# Patient Record
Sex: Female | Born: 1950 | Race: Black or African American | Hispanic: No | Marital: Single | Smoking: Never smoker
Health system: Southern US, Community
[De-identification: ages and names within clinical notes are randomized; demographics above are authoritative.]

## PROBLEM LIST (undated history)

## (undated) DIAGNOSIS — I10 Essential (primary) hypertension: Secondary | ICD-10-CM

## (undated) DIAGNOSIS — K429 Umbilical hernia without obstruction or gangrene: Secondary | ICD-10-CM

## (undated) DIAGNOSIS — E785 Hyperlipidemia, unspecified: Secondary | ICD-10-CM

## (undated) HISTORY — DX: Hyperlipidemia, unspecified: E78.5

## (undated) HISTORY — DX: Essential (primary) hypertension: I10

## (undated) HISTORY — PX: TUBAL LIGATION: SHX77

## (undated) HISTORY — DX: Umbilical hernia without obstruction or gangrene: K42.9

---

## 2015-06-18 DIAGNOSIS — I10 Essential (primary) hypertension: Secondary | ICD-10-CM | POA: Diagnosis not present

## 2015-06-18 DIAGNOSIS — E559 Vitamin D deficiency, unspecified: Secondary | ICD-10-CM | POA: Diagnosis not present

## 2015-06-18 DIAGNOSIS — D72819 Decreased white blood cell count, unspecified: Secondary | ICD-10-CM | POA: Diagnosis not present

## 2015-06-18 DIAGNOSIS — Z139 Encounter for screening, unspecified: Secondary | ICD-10-CM | POA: Diagnosis not present

## 2015-08-22 ENCOUNTER — Ambulatory Visit (INDEPENDENT_AMBULATORY_CARE_PROVIDER_SITE_OTHER): Payer: BLUE CROSS/BLUE SHIELD | Admitting: Adult Health

## 2015-08-22 ENCOUNTER — Encounter: Payer: Self-pay | Admitting: Adult Health

## 2015-08-22 ENCOUNTER — Other Ambulatory Visit (HOSPITAL_COMMUNITY)
Admission: RE | Admit: 2015-08-22 | Discharge: 2015-08-22 | Disposition: A | Discharge status: Home / Self Care | Payer: BLUE CROSS/BLUE SHIELD | Source: Ambulatory Visit | Attending: Adult Health | Admitting: Adult Health

## 2015-08-22 ENCOUNTER — Other Ambulatory Visit: Payer: Self-pay | Admitting: Adult Health

## 2015-08-22 VITALS — BP 132/80 | HR 88 | Ht 66.0 in | Wt 140.0 lb

## 2015-08-22 DIAGNOSIS — K429 Umbilical hernia without obstruction or gangrene: Secondary | ICD-10-CM | POA: Diagnosis not present

## 2015-08-22 DIAGNOSIS — Z1211 Encounter for screening for malignant neoplasm of colon: Secondary | ICD-10-CM | POA: Diagnosis not present

## 2015-08-22 DIAGNOSIS — Z139 Encounter for screening, unspecified: Secondary | ICD-10-CM

## 2015-08-22 DIAGNOSIS — Z01419 Encounter for gynecological examination (general) (routine) without abnormal findings: Secondary | ICD-10-CM | POA: Diagnosis not present

## 2015-08-22 DIAGNOSIS — Z1231 Encounter for screening mammogram for malignant neoplasm of breast: Secondary | ICD-10-CM

## 2015-08-22 DIAGNOSIS — Z1151 Encounter for screening for human papillomavirus (HPV): Secondary | ICD-10-CM | POA: Diagnosis not present

## 2015-08-22 DIAGNOSIS — Z01411 Encounter for gynecological examination (general) (routine) with abnormal findings: Secondary | ICD-10-CM

## 2015-08-22 HISTORY — DX: Umbilical hernia without obstruction or gangrene: K42.9

## 2015-08-22 LAB — HEMOCCULT GUIAC POC 1CARD (OFFICE): Fecal Occult Blood, POC: NEGATIVE

## 2015-08-22 NOTE — Progress Notes (Signed)
Patient ID: Ruth Garrett, female   DOB: 1950-12-24, 65 y.o.   MRN: [id number] History of Present Illness: Ruth Garrett is a 65 year old black female, single, G1P1, PM, in for a well woman gyn exam and pap.She is new to this practice and has not had pap in 20 years, has never had a mammogram or colonoscopy. She works 12-8 at [employer], and the NP there check her labs.   Current Medications, Allergies, Past Medical History, Past Surgical History, Family History and Social History were reviewed in [employer] record.     Review of Systems:  Patient denies any headaches, hearing loss, fatigue, blurred vision, shortness of breath, chest pain, abdominal pain, problems with bowel movements, urination, or intercourse(not having sex). No joint pain or mood swings.   Physical Exam:BP 132/80 mmHg  Pulse 88  Ht 5\' 6"  (1.676 m)  Wt 140 lb (63.504 kg)  BMI 22.61 kg/m2 General:  Well developed, well nourished, no acute distress Skin:  Warm and dry Neck:  Midline trachea, normal thyroid, good ROM, no lymphadenopathy,no carotid bruits heard  Lungs; Clear to auscultation bilaterally Breast:  No dominant palpable mass, retraction, or nipple discharge Cardiovascular: Regular rate and rhythm Abdomen:  Soft, non tender, no hepatosplenomegaly,has small umbilical hernia Pelvic:  External genitalia is normal in appearance, no lesions.  The vagina is normal in appearance. Urethra has no lesions or masses. The cervix is smooth, pap with HPV performed.  Uterus is felt to be normal size, shape, and contour.  No adnexal masses or tenderness noted.Bladder is non tender, no masses felt. Rectal: Good sphincter tone, no polyps, or hemorrhoids felt.  Hemoccult negative. Extremities/musculoskeletal:  No swelling or varicosities noted, no clubbing or cyanosis Psych:  No mood changes, alert and cooperative,seems happy Instructed if navel area hurts and feels hard go to ER.  Impression: Well woman gyn exam and  pap Umbilical hernia     Plan: Mammogram appt made for 6/19 at 6:30 pm at [facility] Referred to Dr Oneida Alar for colonoscopy  Labs at work Physical in 1 year, pap in 3 if normal Review handout on hernia She needs PCP, several names given, as she hopes to retire soon

## 2015-08-22 NOTE — Patient Instructions (Addendum)
Physical in 1 year, pap in 3 years if normal Mammogram   6/19 at 6:30 pm at [facility] at work  Colonoscopy advised referred Dr Oneida Alar  Hernia A hernia happens when an organ or tissue inside your body pushes out through a weak spot in the belly (abdomen). HOME CARE  Avoid stretching or overusing (straining) the muscles near the hernia.  Do not lift anything heavier than 10 lb (4.5 kg).  Use the muscles in your leg when you lift something up. Do not use the muscles in your back.  When you cough, try to cough gently.  Eat a diet that has a lot of fiber. Eat lots of fruits and vegetables.  Drink enough fluids to keep your pee (urine) clear or pale yellow. Try to drink 6-8 glasses of water a day.  Take medicines to make your poop soft (stool softeners) as told by your doctor.  Lose weight, if you are overweight.  Do not use any tobacco products, including cigarettes, chewing tobacco, or electronic cigarettes. If you need help quitting, ask your doctor.  Keep all follow-up visits as told by your doctor. This is important. GET HELP IF:  The skin by the hernia gets puffy (swollen) or red.  The hernia is painful. GET HELP RIGHT AWAY IF:  You have a fever.  You have belly pain that is getting worse.  You feel sick to your stomach (nauseous) or you throw up (vomit).  You cannot push the hernia back in place by gently pressing on it while you are lying down.  The hernia:  Changes in shape or size.  Is stuck outside your belly.  Changes color.  Feels hard or tender.   This information is not intended to replace advice given to you by your health care provider. Make sure you discuss any questions you have with your health care provider.   Document Released: 08/13/2009 Document Revised: 03/16/2014 Document Reviewed: 01/03/2014 Elsevier Interactive Patient Education [employer].

## 2015-08-23 LAB — CYTOLOGY - PAP

## 2015-08-26 ENCOUNTER — Ambulatory Visit (HOSPITAL_COMMUNITY)
Admission: RE | Admit: 2015-08-26 | Discharge: 2015-08-26 | Disposition: A | Discharge status: Home / Self Care | Payer: BLUE CROSS/BLUE SHIELD | Source: Ambulatory Visit | Attending: Adult Health | Admitting: Adult Health

## 2015-08-26 DIAGNOSIS — Z1231 Encounter for screening mammogram for malignant neoplasm of breast: Secondary | ICD-10-CM | POA: Diagnosis not present

## 2015-09-16 ENCOUNTER — Telehealth: Payer: Self-pay

## 2015-09-16 NOTE — Telephone Encounter (Signed)
[place] [facility]

## 2015-09-17 ENCOUNTER — Telehealth: Payer: Self-pay

## 2015-09-23 NOTE — Telephone Encounter (Signed)
Please see separate triage.

## 2015-09-23 NOTE — Telephone Encounter (Signed)
LMOM for a return call.  

## 2015-09-25 NOTE — Telephone Encounter (Signed)
Gastroenterology Pre-Procedure Review  Request Date: 09/17/2015 Requesting Physician: Derrek Monaco, NP  PATIENT REVIEW QUESTIONS: The patient responded to the following health history questions as indicated:    1. Diabetes Melitis: no 2. Joint replacements in the past 12 months: no 3. Major health problems in the past 3 months: no 4. Has an artificial valve or MVP: no 5. Has a defibrillator: no 6. Has been advised in past to take antibiotics in advance of a procedure like teeth cleaning: no 7. Family history of colon cancer: no  8. Alcohol Use: no 9. History of sleep apnea: no     MEDICATIONS & ALLERGIES:    Patient reports the following regarding taking any blood thinners:   Plavix? no Aspirin? no Coumadin? no  Patient confirms/reports the following medications:  Current Outpatient Prescriptions  Medication Sig Dispense Refill  . cholecalciferol (VITAMIN D) 1000 units tablet Take 2,000 Units by mouth daily.    Marland Kitchen guaiFENesin (MUCINEX) 600 MG 12 hr tablet Take 600 mg by mouth 2 (two) times daily as needed.    Marland Kitchen lisinopril-hydrochlorothiazide (PRINZIDE,ZESTORETIC) 10-12.5 MG tablet Take 1 tablet by mouth daily.     No current facility-administered medications for this visit.    Patient confirms/reports the following allergies:  No Known Allergies  No orders of the defined types were placed in this encounter.    AUTHORIZATION INFORMATION Primary Insurance:   ID #:   Group #:  Pre-Cert / Auth required:  Pre-Cert / Auth #:   Secondary Insurance:   ID #:   Group #:  Pre-Cert / Auth required: Pre-Cert / Auth #:   SCHEDULE INFORMATION: Procedure has been scheduled as follows:  Date: 10/18/2015             Time: 9:30 AM Location: [facility] Short Stay  This Gastroenterology Pre-Precedure Review Form is being routed to the following provider(s): Barney Drain, MD

## 2015-09-25 NOTE — Telephone Encounter (Signed)
SUPREP SPLIT DOSING- FULL LIQUIDS WITH BREAKFAST.  Full Liquid Diet A high-calorie, high-protein supplement should be used to meet your nutritional requirements when the full liquid diet is continued for more than 2 or 3 days. If this diet is to be used for an extended period of time (more than 7 days), a multivitamin should be considered.  Breads and Starches  Allowed: None are allowed   Avoid: Any others.    Potatoes/Pasta/Rice  Allowed: ANY ITEM AS A SOUP OR SMALL PLATE OF MASHED POTATOES OR SCRAMBLED EGGS.       Vegetables  Allowed: Strained tomato or vegetable juice. Vegetables pureed in soup.   Avoid: Any others.    Fruit  Allowed: Any strained fruit juices and fruit drinks. Include 1 serving of citrus or vitamin C-enriched fruit juice daily.   Avoid: Any others.  Meat and Meat Substitutes  Allowed: Egg  Avoid: Any meat, fish, or fowl. All cheese.  Milk  Allowed: SOY Milk beverages, including milk shakes and instant breakfast mixes. Smooth yogurt.   Avoid: Any others. Avoid dairy products if not tolerated.    Soups and Combination Foods  Allowed: Broth, strained cream soups. Strained, broth-based soups.   Avoid: Any others.    Desserts and Sweets  Allowed: flavored gelatin, tapioca, ice cream, sherbet, smooth pudding, junket, fruit ices, frozen ice pops, pudding pops, frozen fudge pops, chocolate syrup. Sugar, honey, jelly, syrup.   Avoid: Any others.  Fats and Oils  Allowed: Margarine, butter, cream, sour cream, oils.   Avoid: Any others.  Beverages  Allowed: All.   Avoid: None.  Condiments  Allowed: Iodized salt, pepper, spices, flavorings. Cocoa powder.   Avoid: Any others.    SAMPLE MEAL PLAN Breakfast   cup orange juice.   1 OR 2 EGGS  1 cup milk.   1 cup beverage (coffee or tea).   Cream or sugar, if desired.    Midmorning Snack  2 SCRAMBLED OR HARD BOILED EGG   Lunch  1 cup cream soup.    cup fruit juice.    1 cup milk.    cup custard.   1 cup beverage (coffee or tea).   Cream or sugar, if desired.    Midafternoon Snack  1 cup milk shake.  Dinner  1 cup cream soup.    cup fruit juice.   1 cup MILK    cup pudding.   1 cup beverage (coffee or tea).   Cream or sugar, if desired.  Evening Snack  1 cup supplement.  To increase calories, add sugar, cream, butter, or margarine if possible. Nutritional supplements will also increase the total calories.

## 2015-09-26 ENCOUNTER — Other Ambulatory Visit: Payer: Self-pay

## 2015-09-26 DIAGNOSIS — Z1211 Encounter for screening for malignant neoplasm of colon: Secondary | ICD-10-CM

## 2015-09-26 MED ORDER — NA SULFATE-K SULFATE-MG SULF 17.5-3.13-1.6 GM/177ML PO SOLN: 1.0000 | ORAL | Status: DC

## 2015-09-26 NOTE — Telephone Encounter (Signed)
Rx sent to the pharmacy and instructions mailed to pt.  

## 2015-10-15 MED ORDER — PEG 3350-KCL-NA BICARB-NACL 420 G PO SOLR
4000.0000 mL | ORAL | 0 refills | Status: DC
Start: 2015-10-15 — End: 2015-10-18

## 2015-10-15 NOTE — Telephone Encounter (Signed)
Left message on machine for pt that the first prep was expensive and the pharmacy sent me a message.  I have sent her in a cheaper prep and faxed the instructions to the pharmacy to be given to her when she picks up the prep.   Asked her to call and confirm that she got the message.

## 2015-10-15 NOTE — Addendum Note (Signed)
Addended by: Everardo All on: 10/15/2015 01:17 PM   Modules accepted: Orders

## 2015-10-15 NOTE — Telephone Encounter (Signed)
Suprep not covered. Sent in [place] and faxed instructions to pharmacy.

## 2015-10-15 NOTE — Telephone Encounter (Signed)
REVIEWED-NO ADDITIONAL RECOMMENDATIONS. 

## 2015-10-17 ENCOUNTER — Telehealth: Payer: Self-pay

## 2015-10-17 NOTE — Telephone Encounter (Signed)
I called [employer] @ [phone] and spoke to [place]. Who said a PA is not required for the screening colonoscopy as out patient at [facility].

## 2015-10-18 ENCOUNTER — Encounter (HOSPITAL_COMMUNITY): Payer: Self-pay | Admitting: *Deleted

## 2015-10-18 ENCOUNTER — Ambulatory Visit (HOSPITAL_COMMUNITY)
Admission: RE | Admit: 2015-10-18 | Discharge: 2015-10-18 | Disposition: A | Discharge status: Home / Self Care | Payer: BLUE CROSS/BLUE SHIELD | Source: Ambulatory Visit | Attending: Gastroenterology | Admitting: Gastroenterology

## 2015-10-18 ENCOUNTER — Encounter (HOSPITAL_COMMUNITY)
Admission: RE | Disposition: A | Discharge status: Home / Self Care | Payer: Self-pay | Source: Ambulatory Visit | Attending: Gastroenterology

## 2015-10-18 DIAGNOSIS — Z1211 Encounter for screening for malignant neoplasm of colon: Secondary | ICD-10-CM

## 2015-10-18 DIAGNOSIS — Z79899 Other long term (current) drug therapy: Secondary | ICD-10-CM | POA: Insufficient documentation

## 2015-10-18 DIAGNOSIS — D122 Benign neoplasm of ascending colon: Secondary | ICD-10-CM | POA: Diagnosis not present

## 2015-10-18 DIAGNOSIS — I10 Essential (primary) hypertension: Secondary | ICD-10-CM | POA: Diagnosis not present

## 2015-10-18 DIAGNOSIS — K644 Residual hemorrhoidal skin tags: Secondary | ICD-10-CM | POA: Diagnosis not present

## 2015-10-18 DIAGNOSIS — K621 Rectal polyp: Secondary | ICD-10-CM | POA: Insufficient documentation

## 2015-10-18 DIAGNOSIS — D128 Benign neoplasm of rectum: Secondary | ICD-10-CM | POA: Diagnosis not present

## 2015-10-18 DIAGNOSIS — K648 Other hemorrhoids: Secondary | ICD-10-CM | POA: Diagnosis not present

## 2015-10-18 HISTORY — PX: COLONOSCOPY: SHX5424

## 2015-10-18 HISTORY — PX: POLYPECTOMY: SHX5525

## 2015-10-18 SURGERY — COLONOSCOPY
Anesthesia: Moderate Sedation

## 2015-10-18 MED ORDER — MIDAZOLAM HCL 5 MG/5ML IJ SOLN
INTRAMUSCULAR | Status: DC | PRN
  Administered 2015-10-18 (×2): 2 mg via INTRAVENOUS

## 2015-10-18 MED ORDER — MIDAZOLAM HCL 5 MG/5ML IJ SOLN
INTRAMUSCULAR | Status: AC
  Filled 2015-10-18: qty 10

## 2015-10-18 MED ORDER — SODIUM CHLORIDE 0.9 % IV SOLN
INTRAVENOUS | Status: DC
  Administered 2015-10-18: 1000 mL via INTRAVENOUS

## 2015-10-18 MED ORDER — MEPERIDINE HCL 100 MG/ML IJ SOLN
INTRAMUSCULAR | Status: DC | PRN
  Administered 2015-10-18 (×2): 25 mg via INTRAVENOUS

## 2015-10-18 MED ORDER — MEPERIDINE HCL 100 MG/ML IJ SOLN
INTRAMUSCULAR | Status: DC
Start: 2015-10-18 — End: 2015-10-18
  Filled 2015-10-18: qty 2

## 2015-10-18 NOTE — Op Note (Signed)
[facility] Patient Name: Ruth Garrett Procedure Date: 10/18/2015 9:01 AM MRN: [id number] Date of Birth: September 16, 1950 Attending MD: Barney Drain , MD CSN: [id number] Age: 65 Admit Type: Outpatient Procedure:                Colonoscopy WITH COLD FORCEPS/SNARE POLYPECTOMY Indications:              Screening for colorectal malignant neoplasm Providers:                Barney Drain, MD, Lurline Del, RN, Isabella Stalling,                            Technician Referring MD:              Medicines:                Meperidine 50 mg IV, Midazolam 4 mg IV Complications:            No immediate complications. Estimated Blood Loss:     Estimated blood loss was minimal. Procedure:                Pre-Anesthesia Assessment:                           - Prior to the procedure, a History and Physical                            was performed, and patient medications and                            allergies were reviewed. The patient's tolerance of                            previous anesthesia was also reviewed. The risks                            and benefits of the procedure and the sedation                            options and risks were discussed with the patient.                            All questions were answered, and informed consent                            was obtained. Prior Anticoagulants: The patient has                            taken no previous anticoagulant or antiplatelet                            agents. ASA Grade Assessment: I - A normal, healthy                            patient. After reviewing the risks and benefits,  the patient was deemed in satisfactory condition to                            undergo the procedure. After obtaining informed                            consent, the colonoscope was passed under direct                            vision. Throughout the procedure, the patient's                            blood pressure, pulse, and  oxygen saturations were                            monitored continuously. The [id number]                            scope was introduced through the anus and advanced                            to the the cecum, identified by appendiceal orifice                            and ileocecal valve. The ileocecal valve,                            appendiceal orifice, and rectum were photographed.                            The colonoscopy was somewhat difficult due to a                            tortuous colon. Successful completion of the                            procedure was aided by COLOWRAP. The patient                            tolerated the procedure well. The quality of the                            bowel preparation was excellent. Scope In: 9:21:09 AM Scope Out: 9:39:08 AM Scope Withdrawal Time: 0 hours 13 minutes 40 seconds  Total Procedure Duration: 0 hours 17 minutes 59 seconds  Findings:      The digital rectal exam findings include non-thrombosed external       hemorrhoids.      A 2 mm polyp was found in the proximal ascending colon. The polyp was       sessile. The polyp was removed with a cold biopsy forceps. Resection and       retrieval were complete.      A 7 mm polyp was found in the proximal ascending colon. The polyp was       sessile.  The polyp was removed with a hot snare. Resection and retrieval       were complete.      A 10 mm polyp was found in the rectum. The polyp was pedunculated. The       polyp was removed with a hot snare. Resection and retrieval were       complete.      Non-bleeding internal hemorrhoids were found. The hemorrhoids were       moderate.      Non-bleeding external hemorrhoids were found. The hemorrhoids were large. Impression:               - Non-thrombosed external hemorrhoids found on                            digital rectal exam.                           - THREE polyps REMOVED                           - Non-bleeding  internal hemorrhoids.                           - Non-bleeding external hemorrhoids. Moderate Sedation:      Moderate (conscious) sedation was administered by the endoscopy nurse       and supervised by the endoscopist. The following parameters were       monitored: oxygen saturation, heart rate, blood pressure, and response       to care. Total physician intraservice time was 29 minutes. Recommendation:           - High fiber diet.                           - Continue present medications.                           - Await pathology results.                           - Repeat colonoscopy in 3 years for surveillance                            WITH COLOWRAP.                           - Patient has a contact number available for                            emergencies. The signs and symptoms of potential                            delayed complications were discussed with the                            patient. Return to normal activities tomorrow.                            Written discharge  instructions were provided to the                            patient. Procedure Code(s):        --- Professional ---                           [phone], Colonoscopy, flexible; with removal of                            tumor(s), polyp(s), or other lesion(s) by snare                            technique                           45380, 59, Colonoscopy, flexible; with biopsy,                            single or multiple                           99152, Moderate sedation services provided by the                            same physician or other qualified health care                            professional performing the diagnostic or                            therapeutic service that the sedation supports,                            requiring the presence of an independent trained                            observer to assist in the monitoring of the                            patient's level of  consciousness and physiological                            status; initial 15 minutes of intraservice time,                            patient age 6 years or older                           [id number], Moderate sedation services; each additional                            15 minutes intraservice time Diagnosis Code(s):        --- Professional ---  Z12.11, Encounter for screening for malignant                            neoplasm of colon                           D12.2, Benign neoplasm of ascending colon                           K62.1, Rectal polyp                           K64.4, Residual hemorrhoidal skin tags                           K64.8, Other hemorrhoids CPT copyright 2016 American Medical Association. All rights reserved. The codes documented in this report are preliminary and upon coder review may  be revised to meet current compliance requirements. Barney Drain, MD Barney Drain, MD 10/18/2015 10:01:49 AM This report has been signed electronically. Number of Addenda: 0

## 2015-10-18 NOTE — Discharge Instructions (Signed)
You have LARGE EXTERNAL AND MODERATE linternal hemorrhoid. YOU HAD THREE POLYPS REMOVED.    DRINK WATER TO KEEP YOUR URINE LIGHT YELLOW.  FOLLOW A HIGH FIBER DIET. AVOID ITEMS THAT CAUSE BLOATING. See info below.  USE PREPARATION H FOUR TIMES  A DAY IF NEEDED TO RELIEVE RECTAL PAIN/PRESSURE/BLEEDING.  YOUR BIOPSY RESULTS WILL BE AVAILABLE IN MY CHART  AUG 15 AND MY OFFICE WILL CONTACT YOU IN 10-14 DAYS WITH YOUR RESULTS.   Next colonoscopy in 3 years.  Colonoscopy Care After Read the instructions outlined below and refer to this sheet in the next week. These discharge instructions provide you with general information on caring for yourself after you leave the hospital. While your treatment has been planned according to the most current medical practices available, unavoidable complications occasionally occur. If you have any problems or questions after discharge, call DR. Chareese Sergent, [phone].  ACTIVITY  You may resume your regular activity, but move at a slower pace for the next 24 hours.   Take frequent rest periods for the next 24 hours.   Walking will help get rid of the air and reduce the bloated feeling in your belly (abdomen).   No driving for 24 hours (because of the medicine (anesthesia) used during the test).   You may shower.   Do not sign any important legal documents or operate any machinery for 24 hours (because of the anesthesia used during the test).    NUTRITION  Drink plenty of fluids.   You may resume your normal diet as instructed by your doctor.   Begin with a light meal and progress to your normal diet. Heavy or fried foods are harder to digest and may make you feel sick to your stomach (nauseated).   Avoid alcoholic beverages for 24 hours or as instructed.    MEDICATIONS  You may resume your normal medications.   WHAT YOU CAN EXPECT TODAY  Some feelings of bloating in the abdomen.   Passage of more gas than usual.   Spotting of blood in  your stool or on the toilet paper  .  IF YOU HAD POLYPS REMOVED DURING THE COLONOSCOPY:  Eat a soft diet IF YOU HAVE NAUSEA, BLOATING, ABDOMINAL PAIN, OR VOMITING.    FINDING OUT THE RESULTS OF YOUR TEST Not all test results are available during your visit. DR. Oneida Alar WILL CALL YOU WITHIN 14 DAYS OF YOUR PROCEDUE WITH YOUR RESULTS. Do not assume everything is normal if you have not heard from DR. Shaelin Lalley, CALL HER OFFICE AT [phone].  SEEK IMMEDIATE MEDICAL ATTENTION AND CALL THE OFFICE: [phone] IF:  You have more than a spotting of blood in your stool.   Your belly is swollen (abdominal distention).   You are nauseated or vomiting.   You have a temperature over 101F.   You have abdominal pain or discomfort that is severe or gets worse throughout the day.  High-Fiber Diet A high-fiber diet changes your normal diet to include more whole grains, legumes, fruits, and vegetables. Changes in the diet involve replacing refined carbohydrates with unrefined foods. The calorie level of the diet is essentially unchanged. The Dietary Reference Intake (recommended amount) for adult males is 38 grams per day. For adult females, it is 25 grams per day. Pregnant and lactating women should consume 28 grams of fiber per day. Fiber is the intact part of a plant that is not broken down during digestion. Functional fiber is fiber that has been isolated from the plant to provide  a beneficial effect in the body. PURPOSE  Increase stool bulk.   Ease and regulate bowel movements.   Lower cholesterol.  REDUCE RISK OF COLON CANCER  INDICATIONS THAT YOU NEED MORE FIBER  Constipation and hemorrhoids.   Uncomplicated diverticulosis (intestine condition) and irritable bowel syndrome.   Weight management.   As a protective measure against hardening of the arteries (atherosclerosis), diabetes, and cancer.   GUIDELINES FOR INCREASING FIBER IN THE DIET  Start adding fiber to the diet slowly. A  gradual increase of about 5 more grams (2 slices of whole-wheat bread, 2 servings of most fruits or vegetables, or 1 bowl of high-fiber cereal) per day is best. Too rapid an increase in fiber may result in constipation, flatulence, and bloating.   Drink enough water and fluids to keep your urine clear or pale yellow. Water, juice, or caffeine-free drinks are recommended. Not drinking enough fluid may cause constipation.   Eat a variety of high-fiber foods rather than one type of fiber.   Try to increase your intake of fiber through using high-fiber foods rather than fiber pills or supplements that contain small amounts of fiber.   The goal is to change the types of food eaten. Do not supplement your present diet with high-fiber foods, but replace foods in your present diet.   INCLUDE A VARIETY OF FIBER SOURCES  Replace refined and processed grains with whole grains, canned fruits with fresh fruits, and incorporate other fiber sources. White rice, white breads, and most bakery goods contain little or no fiber.   Brown whole-grain rice, buckwheat oats, and many fruits and vegetables are all good sources of fiber. These include: broccoli, Brussels sprouts, cabbage, cauliflower, beets, sweet potatoes, white potatoes (skin on), carrots, tomatoes, eggplant, squash, berries, fresh fruits, and dried fruits.   Cereals appear to be the richest source of fiber. Cereal fiber is found in whole grains and bran. Bran is the fiber-rich outer coat of cereal grain, which is largely removed in refining. In whole-grain cereals, the bran remains. In breakfast cereals, the largest amount of fiber is found in those with "bran" in their names. The fiber content is sometimes indicated on the label.   You may need to include additional fruits and vegetables each day.   In baking, for 1 cup white flour, you may use the following substitutions:   1 cup whole-wheat flour minus 2 tablespoons.   1/2 cup white flour plus  1/2 cup whole-wheat flour.   Polyps, Colon  A polyp is extra tissue that grows inside your body. Colon polyps grow in the large intestine. The large intestine, also called the colon, is part of your digestive system. It is a long, hollow tube at the end of your digestive tract where your body makes and stores stool. Most polyps are not dangerous. They are benign. This means they are not cancerous. But over time, some types of polyps can turn into cancer. Polyps that are smaller than a pea are usually not harmful. But larger polyps could someday become or may already be cancerous. To be safe, doctors remove all polyps and test them.   PREVENTION There is not one sure way to prevent polyps. You might be able to lower your risk of getting them if you:  Eat more fruits and vegetables and less fatty food.   Do not smoke.   Avoid alcohol.   Exercise every day.   Lose weight if you are overweight.   Eating more calcium and folate can  also lower your risk of getting polyps. Some foods that are rich in calcium are milk, cheese, and broccoli. Some foods that are rich in folate are chickpeas, kidney beans, and spinach.     Hemorrhoids Hemorrhoids are dilated (enlarged) veins around the rectum. Sometimes clots will form in the veins. This makes them swollen and painful. These are called thrombosed hemorrhoids. Causes of hemorrhoids include:  Constipation.   Straining to have a bowel movement.   HEAVY LIFTING  HOME CARE INSTRUCTIONS  Eat a well balanced diet and drink 6 to 8 glasses of water every day to avoid constipation. You may also use a bulk laxative.   Avoid straining to have bowel movements.   Keep anal area dry and clean.   Do not use a donut shaped pillow or sit on the toilet for long periods. This increases blood pooling and pain.   Move your bowels when your body has the urge; this will require less straining and will decrease pain and pressure.

## 2015-10-18 NOTE — H&P (Signed)
  Primary Care Physician:  No PCP Per Patient Primary Gastroenterologist:  Dr. Oneida Alar  Pre-Procedure History & Physical: HPI:  Ruth Garrett is a 65 y.o. female here for [place].  Past Medical History:  Diagnosis Date  . Hypertension   . Umbilical hernia 9/68/8648    Past Surgical History:  Procedure Laterality Date  . TUBAL LIGATION      Prior to Admission medications   Medication Sig Start Date End Date Taking? Authorizing Provider  cholecalciferol (VITAMIN D) 1000 units tablet Take 2,000 Units by mouth daily.   Yes Historical Provider, MD  lisinopril-hydrochlorothiazide (PRINZIDE,ZESTORETIC) 10-12.5 MG tablet Take 1 tablet by mouth daily.   Yes Historical Provider, MD  Na Sulfate-K Sulfate-Mg Sulf (SUPREP BOWEL PREP KIT) 17.5-3.13-1.6 GM/180ML SOLN Take 1 kit by mouth as directed. 09/26/15  Yes Danie Binder, MD  guaiFENesin (MUCINEX) 600 MG 12 hr tablet Take 600 mg by mouth 2 (two) times daily as needed.    Historical Provider, MD  polyethylene glycol-electrolytes (TRILYTE) 420 g solution Take 4,000 mLs by mouth as directed. 10/15/15   Danie Binder, MD    Allergies as of 09/26/2015  . (No Known Allergies)    Family History  Problem Relation Age of Onset  . Stroke Mother   . Cancer Father     kidney  . Cancer Sister     lung  . Hypertension Son   . Other Sister     brain tumor  . Other Sister     heart problems  . Other Sister     heart problems    Social History   Social History  . Marital status: Single    Spouse name: N/A  . Number of children: N/A  . Years of education: N/A   Occupational History  . Not on file.   Social History Main Topics  . Smoking status: Never Smoker  . Smokeless tobacco: Never Used  . Alcohol use No  . Drug use: No  . Sexual activity: Not Currently    Birth control/ protection: Post-menopausal   Other Topics Concern  . Not on file   Social History Narrative  . No narrative on file    Review of  Systems: See HPI, otherwise negative ROS   Physical Exam: BP (!) 141/74   Pulse 74   Temp 98.5 F (36.9 C) (Oral)   Resp 17   Ht _0  (1.676 m)   Wt 140 lb (63.5 kg)   SpO2 100%   BMI 22.60 kg/m  General:   Alert,  pleasant and cooperative in NAD Head:  Normocephalic and atraumatic. Neck:  Supple; Lungs:  Clear throughout to auscultation.    Heart:  Regular rate and rhythm. Abdomen:  Soft, nontender and nondistended. Normal bowel sounds, without guarding, and without rebound.   Neurologic:  Alert and  oriented x4;  grossly normal neurologically.  Impression/Plan:     SCREENING  Plan:  1. TCS TODAY

## 2015-10-23 ENCOUNTER — Encounter (HOSPITAL_COMMUNITY): Payer: Self-pay | Admitting: Gastroenterology

## 2015-10-24 ENCOUNTER — Telehealth: Payer: Self-pay | Admitting: Gastroenterology

## 2015-10-24 NOTE — Telephone Encounter (Signed)
5-10 YEARS IS CORRECT-ONLY 2 SIMPLE ADENOMAS REMOVED.

## 2015-10-24 NOTE — Telephone Encounter (Signed)
I had NICed patient to have colonoscopy again in 3 years. Is this correct because you also noted 5-10 yrs. Please advise.

## 2015-10-24 NOTE — Telephone Encounter (Signed)
Please call pt. 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   DRINK WATER TO KEEP YOUR URINE LIGHT YELLOW.  FOLLOW A HIGH FIBER DIET. AVOID ITEMS THAT CAUSE BLOATING.   USE PREPARATION H FOUR TIMES  A DAY IF NEEDED TO RELIEVE RECTAL PAIN/PRESSURE/BLEEDING.  Next colonoscopy in 5-10 years.

## 2015-10-24 NOTE — Telephone Encounter (Signed)
LMOM to call.

## 2015-10-25 ENCOUNTER — Telehealth: Payer: Self-pay | Admitting: Gastroenterology

## 2015-10-25 NOTE — Telephone Encounter (Signed)
Pt was returning a call to DS. Please call [phone]

## 2015-10-31 NOTE — Telephone Encounter (Signed)
See results note. Pt is aware.  

## 2015-10-31 NOTE — Telephone Encounter (Signed)
PT is aware of results.  

## 2015-11-19 DIAGNOSIS — E559 Vitamin D deficiency, unspecified: Secondary | ICD-10-CM | POA: Diagnosis not present

## 2015-11-19 DIAGNOSIS — D72819 Decreased white blood cell count, unspecified: Secondary | ICD-10-CM | POA: Diagnosis not present

## 2015-11-19 DIAGNOSIS — Z139 Encounter for screening, unspecified: Secondary | ICD-10-CM | POA: Diagnosis not present

## 2015-11-19 DIAGNOSIS — Z79899 Other long term (current) drug therapy: Secondary | ICD-10-CM | POA: Diagnosis not present

## 2015-12-03 DIAGNOSIS — E559 Vitamin D deficiency, unspecified: Secondary | ICD-10-CM | POA: Diagnosis not present

## 2015-12-03 DIAGNOSIS — D72819 Decreased white blood cell count, unspecified: Secondary | ICD-10-CM | POA: Diagnosis not present

## 2015-12-03 DIAGNOSIS — I10 Essential (primary) hypertension: Secondary | ICD-10-CM | POA: Diagnosis not present

## 2016-01-13 DIAGNOSIS — Z6823 Body mass index (BMI) 23.0-23.9, adult: Secondary | ICD-10-CM | POA: Diagnosis not present

## 2016-01-13 DIAGNOSIS — I10 Essential (primary) hypertension: Secondary | ICD-10-CM | POA: Diagnosis not present

## 2016-01-13 DIAGNOSIS — R7989 Other specified abnormal findings of blood chemistry: Secondary | ICD-10-CM | POA: Diagnosis not present

## 2016-01-14 DIAGNOSIS — D72819 Decreased white blood cell count, unspecified: Secondary | ICD-10-CM | POA: Diagnosis not present

## 2016-01-21 DIAGNOSIS — I10 Essential (primary) hypertension: Secondary | ICD-10-CM | POA: Diagnosis not present

## 2016-01-21 DIAGNOSIS — D72819 Decreased white blood cell count, unspecified: Secondary | ICD-10-CM | POA: Diagnosis not present

## 2016-01-22 ENCOUNTER — Other Ambulatory Visit (HOSPITAL_COMMUNITY): Payer: Self-pay | Admitting: Internal Medicine

## 2016-01-22 DIAGNOSIS — Z79899 Other long term (current) drug therapy: Secondary | ICD-10-CM

## 2016-01-27 ENCOUNTER — Ambulatory Visit (HOSPITAL_COMMUNITY)
Admission: RE | Admit: 2016-01-27 | Discharge: 2016-01-27 | Disposition: A | Discharge status: Home / Self Care | Payer: BLUE CROSS/BLUE SHIELD | Source: Ambulatory Visit | Attending: Internal Medicine | Admitting: Internal Medicine

## 2016-01-27 DIAGNOSIS — Z780 Asymptomatic menopausal state: Secondary | ICD-10-CM | POA: Insufficient documentation

## 2016-01-27 DIAGNOSIS — Z79899 Other long term (current) drug therapy: Secondary | ICD-10-CM

## 2016-01-27 DIAGNOSIS — Z13820 Encounter for screening for osteoporosis: Secondary | ICD-10-CM | POA: Insufficient documentation

## 2016-02-03 DIAGNOSIS — Z0000 Encounter for general adult medical examination without abnormal findings: Secondary | ICD-10-CM | POA: Diagnosis not present

## 2016-02-03 DIAGNOSIS — R7989 Other specified abnormal findings of blood chemistry: Secondary | ICD-10-CM | POA: Diagnosis not present

## 2016-02-03 DIAGNOSIS — I10 Essential (primary) hypertension: Secondary | ICD-10-CM | POA: Diagnosis not present

## 2016-02-18 DIAGNOSIS — I10 Essential (primary) hypertension: Secondary | ICD-10-CM | POA: Diagnosis not present

## 2016-04-07 DIAGNOSIS — I10 Essential (primary) hypertension: Secondary | ICD-10-CM | POA: Diagnosis not present

## 2016-04-21 DIAGNOSIS — I10 Essential (primary) hypertension: Secondary | ICD-10-CM | POA: Diagnosis not present

## 2016-06-02 DIAGNOSIS — Z008 Encounter for other general examination: Secondary | ICD-10-CM | POA: Diagnosis not present

## 2016-06-02 DIAGNOSIS — Z713 Dietary counseling and surveillance: Secondary | ICD-10-CM | POA: Diagnosis not present

## 2016-06-02 DIAGNOSIS — E559 Vitamin D deficiency, unspecified: Secondary | ICD-10-CM | POA: Diagnosis not present

## 2016-06-02 DIAGNOSIS — Z79899 Other long term (current) drug therapy: Secondary | ICD-10-CM | POA: Diagnosis not present

## 2016-06-02 DIAGNOSIS — D72819 Decreased white blood cell count, unspecified: Secondary | ICD-10-CM | POA: Diagnosis not present

## 2016-06-02 DIAGNOSIS — I10 Essential (primary) hypertension: Secondary | ICD-10-CM | POA: Diagnosis not present

## 2016-06-16 DIAGNOSIS — I10 Essential (primary) hypertension: Secondary | ICD-10-CM | POA: Diagnosis not present

## 2016-06-16 DIAGNOSIS — D72819 Decreased white blood cell count, unspecified: Secondary | ICD-10-CM | POA: Diagnosis not present

## 2016-09-10 ENCOUNTER — Other Ambulatory Visit: Payer: Self-pay | Admitting: Adult Health

## 2016-09-10 DIAGNOSIS — Z1231 Encounter for screening mammogram for malignant neoplasm of breast: Secondary | ICD-10-CM

## 2016-09-14 ENCOUNTER — Ambulatory Visit (HOSPITAL_COMMUNITY)
Admission: RE | Admit: 2016-09-14 | Discharge: 2016-09-14 | Disposition: A | Discharge status: Home / Self Care | Payer: BLUE CROSS/BLUE SHIELD | Source: Ambulatory Visit | Attending: Adult Health | Admitting: Adult Health

## 2016-09-14 ENCOUNTER — Ambulatory Visit (HOSPITAL_COMMUNITY): Payer: BLUE CROSS/BLUE SHIELD

## 2016-09-14 DIAGNOSIS — Z1231 Encounter for screening mammogram for malignant neoplasm of breast: Secondary | ICD-10-CM | POA: Insufficient documentation

## 2016-09-29 DIAGNOSIS — I10 Essential (primary) hypertension: Secondary | ICD-10-CM | POA: Diagnosis not present

## 2016-10-05 ENCOUNTER — Ambulatory Visit (INDEPENDENT_AMBULATORY_CARE_PROVIDER_SITE_OTHER): Payer: BLUE CROSS/BLUE SHIELD | Admitting: Adult Health

## 2016-10-05 ENCOUNTER — Encounter: Payer: Self-pay | Admitting: Adult Health

## 2016-10-05 VITALS — BP 128/68 | HR 72 | Ht 66.0 in | Wt 146.0 lb

## 2016-10-05 DIAGNOSIS — Z01419 Encounter for gynecological examination (general) (routine) without abnormal findings: Secondary | ICD-10-CM | POA: Diagnosis not present

## 2016-10-05 DIAGNOSIS — Z1212 Encounter for screening for malignant neoplasm of rectum: Secondary | ICD-10-CM

## 2016-10-05 DIAGNOSIS — Z1211 Encounter for screening for malignant neoplasm of colon: Secondary | ICD-10-CM | POA: Diagnosis not present

## 2016-10-05 LAB — HEMOCCULT GUIAC POC 1CARD (OFFICE): FECAL OCCULT BLD: NEGATIVE

## 2016-10-05 NOTE — Progress Notes (Signed)
Patient ID: Beyonce Sawatzky, female   DOB: 1951-02-05, 66 y.o.   MRN: [id number] History of Present Illness: Latrelle is a 66 year old black female, PM in for well woman gyn exam, she had normal pap with negative HPV 08/22/15.She is still working at [employer].  PCP is Dr Nevada Crane.   Current Medications, Allergies, Past Medical History, Past Surgical History, Family History and Social History were reviewed in [employer] [employer] record.     Review of Systems: Patient denies any headaches, hearing loss, fatigue, blurred vision, shortness of breath, chest pain, abdominal pain, problems with bowel movements, urination, or intercourse(not having sex). No joint pain or mood swings.    Physical Exam:BP 128/68 (BP Location: Right Arm, Patient Position: Sitting, Cuff Size: Normal)   Pulse 72   Ht 5\' 6"  (1.676 m)   Wt 146 lb (66.2 kg)   BMI 23.57 kg/m  General:  Well developed, well nourished, no acute distress Skin:  Warm and dry Neck:  Midline trachea, normal thyroid, good ROM, no lymphadenopathy,no carotid bruits heard Lungs; Clear to auscultation bilaterally Breast:  No dominant palpable mass, retraction, or nipple discharge Cardiovascular: Regular rate and rhythm Abdomen:  Soft, non tender, no hepatosplenomegaly,small umbilical hernia  Pelvic:  External genitalia is normal in appearance, no lesions.  The vagina is normal in appearance.+cystocele. Urethra has no lesions or masses. The cervix is bulbous.  Uterus is felt to be normal size, shape, and contour.  No adnexal masses or tenderness noted.Bladder is non tender, no masses felt. Rectal: Good sphincter tone, no polyps, or hemorrhoids felt.  Hemoccult negative. Extremities/musculoskeletal:  No swelling or varicosities noted, no clubbing or cyanosis Psych:  No mood changes, alert and cooperative,seems happy PHQ 2 score 0.  Impression: 1. Well woman exam with routine gynecological exam   2. Screening for colorectal cancer        Plan:  Physical in 1 year Pap in 2020  Mammogram yearly Labs at work  Colonoscopy per GI

## 2016-10-05 NOTE — Patient Instructions (Addendum)
Physical in 1 year Pap in 2020  Mammogram yearly Labs at work  Colonoscopy per GI

## 2016-11-24 DIAGNOSIS — Z79899 Other long term (current) drug therapy: Secondary | ICD-10-CM | POA: Diagnosis not present

## 2016-11-24 DIAGNOSIS — Z139 Encounter for screening, unspecified: Secondary | ICD-10-CM | POA: Diagnosis not present

## 2016-11-24 DIAGNOSIS — E559 Vitamin D deficiency, unspecified: Secondary | ICD-10-CM | POA: Diagnosis not present

## 2016-12-08 DIAGNOSIS — R7301 Impaired fasting glucose: Secondary | ICD-10-CM | POA: Diagnosis not present

## 2016-12-08 DIAGNOSIS — I10 Essential (primary) hypertension: Secondary | ICD-10-CM | POA: Diagnosis not present

## 2016-12-08 DIAGNOSIS — D72819 Decreased white blood cell count, unspecified: Secondary | ICD-10-CM | POA: Diagnosis not present

## 2017-02-03 DIAGNOSIS — Z0000 Encounter for general adult medical examination without abnormal findings: Secondary | ICD-10-CM | POA: Diagnosis not present

## 2017-02-04 DIAGNOSIS — Z0000 Encounter for general adult medical examination without abnormal findings: Secondary | ICD-10-CM | POA: Diagnosis not present

## 2017-02-09 DIAGNOSIS — R7301 Impaired fasting glucose: Secondary | ICD-10-CM | POA: Diagnosis not present

## 2017-02-09 DIAGNOSIS — I10 Essential (primary) hypertension: Secondary | ICD-10-CM | POA: Diagnosis not present

## 2017-02-09 DIAGNOSIS — D72819 Decreased white blood cell count, unspecified: Secondary | ICD-10-CM | POA: Diagnosis not present

## 2017-06-22 DIAGNOSIS — Z139 Encounter for screening, unspecified: Secondary | ICD-10-CM | POA: Diagnosis not present

## 2017-06-22 DIAGNOSIS — R7301 Impaired fasting glucose: Secondary | ICD-10-CM | POA: Diagnosis not present

## 2017-06-22 DIAGNOSIS — Z008 Encounter for other general examination: Secondary | ICD-10-CM | POA: Diagnosis not present

## 2017-06-22 DIAGNOSIS — E559 Vitamin D deficiency, unspecified: Secondary | ICD-10-CM | POA: Diagnosis not present

## 2017-06-22 DIAGNOSIS — I10 Essential (primary) hypertension: Secondary | ICD-10-CM | POA: Diagnosis not present

## 2017-06-22 DIAGNOSIS — Z79899 Other long term (current) drug therapy: Secondary | ICD-10-CM | POA: Diagnosis not present

## 2017-07-06 DIAGNOSIS — I10 Essential (primary) hypertension: Secondary | ICD-10-CM | POA: Diagnosis not present

## 2017-07-06 DIAGNOSIS — D72819 Decreased white blood cell count, unspecified: Secondary | ICD-10-CM | POA: Diagnosis not present

## 2017-07-06 DIAGNOSIS — E559 Vitamin D deficiency, unspecified: Secondary | ICD-10-CM | POA: Diagnosis not present

## 2017-07-06 DIAGNOSIS — R7301 Impaired fasting glucose: Secondary | ICD-10-CM | POA: Diagnosis not present

## 2017-09-06 ENCOUNTER — Other Ambulatory Visit: Payer: Self-pay | Admitting: Adult Health

## 2017-09-06 DIAGNOSIS — Z1231 Encounter for screening mammogram for malignant neoplasm of breast: Secondary | ICD-10-CM

## 2017-09-20 ENCOUNTER — Ambulatory Visit (HOSPITAL_COMMUNITY)
Admission: RE | Admit: 2017-09-20 | Discharge: 2017-09-20 | Disposition: A | Discharge status: Home / Self Care | Payer: BLUE CROSS/BLUE SHIELD | Source: Ambulatory Visit | Attending: Adult Health | Admitting: Adult Health

## 2017-09-20 DIAGNOSIS — Z1231 Encounter for screening mammogram for malignant neoplasm of breast: Secondary | ICD-10-CM | POA: Insufficient documentation

## 2017-09-21 ENCOUNTER — Other Ambulatory Visit: Payer: Self-pay | Admitting: Adult Health

## 2017-09-21 DIAGNOSIS — R928 Other abnormal and inconclusive findings on diagnostic imaging of breast: Secondary | ICD-10-CM

## 2017-09-28 ENCOUNTER — Ambulatory Visit (HOSPITAL_COMMUNITY)
Admission: RE | Admit: 2017-09-28 | Discharge: 2017-09-28 | Disposition: A | Discharge status: Home / Self Care | Payer: BLUE CROSS/BLUE SHIELD | Source: Ambulatory Visit | Attending: Adult Health | Admitting: Adult Health

## 2017-09-28 DIAGNOSIS — R928 Other abnormal and inconclusive findings on diagnostic imaging of breast: Secondary | ICD-10-CM | POA: Diagnosis not present

## 2017-09-28 DIAGNOSIS — R922 Inconclusive mammogram: Secondary | ICD-10-CM | POA: Diagnosis not present

## 2017-10-26 DIAGNOSIS — Z139 Encounter for screening, unspecified: Secondary | ICD-10-CM | POA: Diagnosis not present

## 2017-10-26 DIAGNOSIS — Z0130 Encounter for examination of blood pressure without abnormal findings: Secondary | ICD-10-CM | POA: Diagnosis not present

## 2017-10-26 DIAGNOSIS — D72819 Decreased white blood cell count, unspecified: Secondary | ICD-10-CM | POA: Diagnosis not present

## 2017-10-26 DIAGNOSIS — Z79899 Other long term (current) drug therapy: Secondary | ICD-10-CM | POA: Diagnosis not present

## 2017-11-09 DIAGNOSIS — E559 Vitamin D deficiency, unspecified: Secondary | ICD-10-CM | POA: Diagnosis not present

## 2017-11-09 DIAGNOSIS — Z719 Counseling, unspecified: Secondary | ICD-10-CM | POA: Diagnosis not present

## 2017-11-09 DIAGNOSIS — Z008 Encounter for other general examination: Secondary | ICD-10-CM | POA: Diagnosis not present

## 2017-11-09 DIAGNOSIS — I10 Essential (primary) hypertension: Secondary | ICD-10-CM | POA: Diagnosis not present

## 2017-11-15 ENCOUNTER — Encounter: Payer: Self-pay | Admitting: Adult Health

## 2017-11-15 ENCOUNTER — Encounter (INDEPENDENT_AMBULATORY_CARE_PROVIDER_SITE_OTHER): Payer: Self-pay

## 2017-11-15 ENCOUNTER — Ambulatory Visit (INDEPENDENT_AMBULATORY_CARE_PROVIDER_SITE_OTHER): Payer: BLUE CROSS/BLUE SHIELD | Admitting: Adult Health

## 2017-11-15 VITALS — BP 127/59 | HR 71 | Ht 66.0 in | Wt 142.0 lb

## 2017-11-15 DIAGNOSIS — N812 Incomplete uterovaginal prolapse: Secondary | ICD-10-CM | POA: Insufficient documentation

## 2017-11-15 DIAGNOSIS — Z1212 Encounter for screening for malignant neoplasm of rectum: Secondary | ICD-10-CM

## 2017-11-15 DIAGNOSIS — Z01419 Encounter for gynecological examination (general) (routine) without abnormal findings: Secondary | ICD-10-CM | POA: Diagnosis not present

## 2017-11-15 DIAGNOSIS — Z1211 Encounter for screening for malignant neoplasm of colon: Secondary | ICD-10-CM | POA: Diagnosis not present

## 2017-11-15 LAB — HEMOCCULT GUIAC POC 1CARD (OFFICE): Fecal Occult Blood, POC: NEGATIVE

## 2017-11-15 NOTE — Progress Notes (Signed)
Patient ID: Ruth Garrett, female   DOB: 1950-06-29, 67 y.o.   MRN: [id number] History of Present Illness: Ruth Garrett is a 67 year old black female in for well woman gyn exam,she had a normal pap with negative HPV 08/22/15.She is still working at [employer]. PCP is Dr Nevada Crane.   Current Medications, Allergies, Past Medical History, Past Surgical History, Family History and Social History were reviewed in [employer] record.     Review of Systems: Patient denies any headaches, hearing loss, fatigue, blurred vision, shortness of breath, chest pain, abdominal pain, problems with bowel movements, urination, or intercourse(not having sex). No joint pain or mood swings.    Physical Exam:BP (!) 127/59 (BP Location: Left Arm, Patient Position: Sitting, Cuff Size: Normal)   Pulse 71   Ht 5\' 6"  (1.676 m)   Wt 142 lb (64.4 kg)   BMI 22.92 kg/m  General:  Well developed, well nourished, no acute distress Skin:  Warm and dry Neck:  Midline trachea, normal thyroid, good ROM, no lymphadenopathy,no carotid bruits heard. Lungs; Clear to auscultation bilaterally Breast:  No dominant palpable mass, retraction, or nipple discharge Cardiovascular: Regular rate and rhythm Abdomen:  Soft, non tender, no hepatosplenomegaly Pelvic:  External genitalia is normal in appearance, no lesions.  The vagina is normal in appearance, has +cystocele, to introitus. Urethra has no lesions or masses. The cervix is bulbous.  Uterus is felt to be normal size, shape, and contour.  No adnexal masses or tenderness noted.Bladder is non tender, no masses felt. Rectal: Good sphincter tone, no polyps, or hemorrhoids felt.  Hemoccult negative. Extremities/musculoskeletal:  No swelling or varicosities noted, no clubbing or cyanosis Psych:  No mood changes, alert and cooperative,seems happy PHQ 9 score 0. Examination chaperoned by Diona Fanti, CMA. Discussed pessary as option and given handout on pessaries.   Impression: 1.  Encounter for well woman exam with routine gynecological exam   2. Screening for colorectal cancer   3. Cystocele with incomplete uterovaginal prolapse       Plan: Pap and physical in 1 year Labs per PCP Mammogram yearly Return October 21 for pessary fitting with Dr Elonda Husky

## 2017-12-27 ENCOUNTER — Encounter: Payer: Self-pay | Admitting: Obstetrics & Gynecology

## 2017-12-27 ENCOUNTER — Ambulatory Visit: Payer: BLUE CROSS/BLUE SHIELD | Admitting: Obstetrics & Gynecology

## 2017-12-27 VITALS — BP 133/67 | HR 82 | Ht 66.0 in | Wt 141.0 lb

## 2017-12-27 DIAGNOSIS — N812 Incomplete uterovaginal prolapse: Secondary | ICD-10-CM

## 2017-12-27 DIAGNOSIS — Z4689 Encounter for fitting and adjustment of other specified devices: Secondary | ICD-10-CM | POA: Diagnosis not present

## 2017-12-27 NOTE — Progress Notes (Signed)
Chief Complaint  Patient presents with  . pessary fitting    Blood pressure 133/67, pulse 82, height 5\' 6"  (1.676 m), weight 141 lb (64 kg).  Ruth Garrett presents today forinitial pessary fitting She is referred from [place] On exam today she has a Grade II-III cysotocoele and uterine descent  She is fit for a milex ring with support #4 with good mucsoa fit no undue pressure is noted and it is comfortable     Ruth Garrett will be sen back in 1 months for continued follow up.  Florian Buff, MD  12/27/2017 9:30 AM

## 2018-01-28 ENCOUNTER — Encounter (INDEPENDENT_AMBULATORY_CARE_PROVIDER_SITE_OTHER): Payer: Self-pay

## 2018-01-28 ENCOUNTER — Ambulatory Visit: Payer: BLUE CROSS/BLUE SHIELD | Admitting: Obstetrics & Gynecology

## 2018-01-28 ENCOUNTER — Encounter: Payer: Self-pay | Admitting: Obstetrics & Gynecology

## 2018-01-28 VITALS — BP 140/63 | HR 99 | Ht 66.0 in | Wt 139.5 lb

## 2018-01-28 DIAGNOSIS — Z4689 Encounter for fitting and adjustment of other specified devices: Secondary | ICD-10-CM

## 2018-01-28 DIAGNOSIS — N812 Incomplete uterovaginal prolapse: Secondary | ICD-10-CM

## 2018-01-28 NOTE — Progress Notes (Signed)
Chief Complaint  Patient presents with  . Follow-up    on Pessary    Blood pressure 140/63, pulse 99, height 5\' 6"  (1.676 m), weight 139 lb 8 oz (63.3 kg).  Ruth Garrett presents today for routine follow up related to her pessary.   She uses a milex ring with support #4 She reports no vaginal discharge or vaginal bleeding.  Exam reveals no undue vaginal mucosal pressure of breakdown, no discharge and no vaginal bleeding.  The pessary is removed, cleaned and replaced without difficulty.    Ruth Garrett will be sen back in 3 months for continued follow up.  Florian Buff, MD  01/28/2018 9:46 AM

## 2018-02-08 DIAGNOSIS — I10 Essential (primary) hypertension: Secondary | ICD-10-CM | POA: Diagnosis not present

## 2018-02-10 DIAGNOSIS — I10 Essential (primary) hypertension: Secondary | ICD-10-CM | POA: Diagnosis not present

## 2018-02-10 DIAGNOSIS — Z0000 Encounter for general adult medical examination without abnormal findings: Secondary | ICD-10-CM | POA: Diagnosis not present

## 2018-04-29 ENCOUNTER — Ambulatory Visit: Payer: BLUE CROSS/BLUE SHIELD | Admitting: Obstetrics & Gynecology

## 2018-05-10 DIAGNOSIS — E559 Vitamin D deficiency, unspecified: Secondary | ICD-10-CM | POA: Diagnosis not present

## 2018-05-10 DIAGNOSIS — Z008 Encounter for other general examination: Secondary | ICD-10-CM | POA: Diagnosis not present

## 2018-05-10 DIAGNOSIS — I10 Essential (primary) hypertension: Secondary | ICD-10-CM | POA: Diagnosis not present

## 2018-05-10 DIAGNOSIS — D72819 Decreased white blood cell count, unspecified: Secondary | ICD-10-CM | POA: Diagnosis not present

## 2018-05-16 ENCOUNTER — Encounter: Payer: Self-pay | Admitting: Obstetrics & Gynecology

## 2018-05-16 ENCOUNTER — Ambulatory Visit: Payer: BLUE CROSS/BLUE SHIELD | Admitting: Obstetrics & Gynecology

## 2018-05-16 ENCOUNTER — Other Ambulatory Visit: Payer: Self-pay

## 2018-05-16 VITALS — BP 129/63 | HR 75 | Ht 66.0 in | Wt 144.0 lb

## 2018-05-16 DIAGNOSIS — Z4689 Encounter for fitting and adjustment of other specified devices: Secondary | ICD-10-CM | POA: Diagnosis not present

## 2018-05-16 DIAGNOSIS — N812 Incomplete uterovaginal prolapse: Secondary | ICD-10-CM

## 2018-05-16 NOTE — Progress Notes (Signed)
Chief Complaint  Patient presents with  . Pessary Check    Blood pressure 129/63, pulse 75, height 5\' 6"  (1.676 m), weight 144 lb (65.3 kg).  Ruth Garrett presents today for routine follow up related to her pessary.   She uses a Milex ring with support #4 She reports no vaginal discharge or vaginal bleeding.  Exam reveals no undue vaginal mucosal pressure of breakdown, no discharge and no vaginal bleeding.  The pessary is removed, cleaned and replaced without difficulty.    [name] will be sen back in 4 months for continued follow up.  Florian Buff, MD  05/16/2018 9:41 AM

## 2018-09-05 ENCOUNTER — Other Ambulatory Visit (HOSPITAL_COMMUNITY): Payer: Self-pay | Admitting: Adult Health

## 2018-09-05 DIAGNOSIS — Z1231 Encounter for screening mammogram for malignant neoplasm of breast: Secondary | ICD-10-CM

## 2018-09-08 ENCOUNTER — Telehealth: Payer: Self-pay | Admitting: Obstetrics & Gynecology

## 2018-09-08 NOTE — Telephone Encounter (Signed)

## 2018-09-12 ENCOUNTER — Other Ambulatory Visit: Payer: Self-pay

## 2018-09-12 ENCOUNTER — Ambulatory Visit (INDEPENDENT_AMBULATORY_CARE_PROVIDER_SITE_OTHER): Payer: BC Managed Care – PPO | Admitting: Obstetrics & Gynecology

## 2018-09-12 ENCOUNTER — Encounter: Payer: Self-pay | Admitting: Obstetrics & Gynecology

## 2018-09-12 VITALS — BP 154/74 | HR 83 | Ht 66.5 in | Wt 142.5 lb

## 2018-09-12 DIAGNOSIS — Z4689 Encounter for fitting and adjustment of other specified devices: Secondary | ICD-10-CM

## 2018-09-12 NOTE — Progress Notes (Signed)
Chief Complaint  Patient presents with  . Pessary cleaning    Blood pressure (!) 154/74, pulse 83, height 5' 6.5" (1.689 m), weight 142 lb 8 oz (64.6 kg).  Ruth Garrett presents today for routine follow up related to her pessary.   She uses a Milex ring with support #4 She reports no vaginal discharge or vaginal bleeding.  Exam reveals no undue vaginal mucosal pressure of breakdown, no discharge and no vaginal bleeding.  The pessary is removed, cleaned and replaced without difficulty.    [name] will be sen back in 4 months for continued follow up.  Florian Buff, MD  09/12/2018 3:17 PM

## 2018-09-19 ENCOUNTER — Ambulatory Visit: Payer: BLUE CROSS/BLUE SHIELD | Admitting: Obstetrics & Gynecology

## 2018-10-07 ENCOUNTER — Ambulatory Visit (HOSPITAL_COMMUNITY): Payer: BLUE CROSS/BLUE SHIELD

## 2018-10-07 DIAGNOSIS — I10 Essential (primary) hypertension: Secondary | ICD-10-CM | POA: Diagnosis not present

## 2018-10-10 ENCOUNTER — Ambulatory Visit (HOSPITAL_COMMUNITY)
Admission: RE | Admit: 2018-10-10 | Discharge: 2018-10-10 | Disposition: A | Discharge status: Home / Self Care | Payer: BC Managed Care – PPO | Source: Ambulatory Visit | Attending: Adult Health | Admitting: Adult Health

## 2018-10-10 ENCOUNTER — Other Ambulatory Visit: Payer: Self-pay

## 2018-10-10 DIAGNOSIS — Z1231 Encounter for screening mammogram for malignant neoplasm of breast: Secondary | ICD-10-CM

## 2019-01-19 ENCOUNTER — Telehealth: Payer: Self-pay | Admitting: Obstetrics & Gynecology

## 2019-01-19 NOTE — Telephone Encounter (Signed)

## 2019-01-20 ENCOUNTER — Encounter: Payer: Self-pay | Admitting: Obstetrics & Gynecology

## 2019-01-20 ENCOUNTER — Ambulatory Visit: Payer: Medicare HMO | Admitting: Obstetrics & Gynecology

## 2019-01-20 ENCOUNTER — Other Ambulatory Visit: Payer: Self-pay

## 2019-01-20 VITALS — BP 134/74 | HR 87 | Ht 66.5 in | Wt 142.0 lb

## 2019-01-20 DIAGNOSIS — Z4689 Encounter for fitting and adjustment of other specified devices: Secondary | ICD-10-CM

## 2019-01-20 DIAGNOSIS — N812 Incomplete uterovaginal prolapse: Secondary | ICD-10-CM

## 2019-01-20 NOTE — Progress Notes (Signed)
Chief Complaint  Patient presents with  . Pessary Check    Blood pressure 134/74, pulse 87, height 5' 6.5" (1.689 m), weight 142 lb (64.4 kg).  Ruth Garrett presents today for routine follow up related to her pessary.   She uses a Milex ring with support #4 She reports no vaginal discharge or vaginal bleeding.  Exam reveals no undue vaginal mucosal pressure of breakdown, no discharge and no vaginal bleeding.  The pessary is removed, cleaned and replaced without difficulty.    Ruth Garrett will be sen back in 4 months for continued follow up.  Florian Buff, MD  01/20/2019 10:27 AM

## 2019-02-17 DIAGNOSIS — Z0000 Encounter for general adult medical examination without abnormal findings: Secondary | ICD-10-CM | POA: Diagnosis not present

## 2019-02-17 DIAGNOSIS — I10 Essential (primary) hypertension: Secondary | ICD-10-CM | POA: Diagnosis not present

## 2019-02-20 DIAGNOSIS — I10 Essential (primary) hypertension: Secondary | ICD-10-CM | POA: Diagnosis not present

## 2019-02-20 DIAGNOSIS — Z0001 Encounter for general adult medical examination with abnormal findings: Secondary | ICD-10-CM | POA: Diagnosis not present

## 2019-02-20 DIAGNOSIS — E782 Mixed hyperlipidemia: Secondary | ICD-10-CM | POA: Diagnosis not present

## 2019-04-12 DIAGNOSIS — R69 Illness, unspecified: Secondary | ICD-10-CM | POA: Diagnosis not present

## 2019-05-19 ENCOUNTER — Ambulatory Visit: Payer: Medicare HMO | Admitting: Obstetrics & Gynecology

## 2019-05-29 ENCOUNTER — Telehealth: Payer: Self-pay | Admitting: Obstetrics & Gynecology

## 2019-05-29 NOTE — Telephone Encounter (Signed)

## 2019-05-30 ENCOUNTER — Other Ambulatory Visit: Payer: Self-pay

## 2019-05-30 ENCOUNTER — Encounter: Payer: Self-pay | Admitting: Obstetrics & Gynecology

## 2019-05-30 ENCOUNTER — Ambulatory Visit (INDEPENDENT_AMBULATORY_CARE_PROVIDER_SITE_OTHER): Payer: Medicare HMO | Admitting: Obstetrics & Gynecology

## 2019-05-30 VITALS — BP 128/67 | HR 77 | Ht 65.5 in | Wt 148.0 lb

## 2019-05-30 DIAGNOSIS — N812 Incomplete uterovaginal prolapse: Secondary | ICD-10-CM

## 2019-05-30 DIAGNOSIS — Z4689 Encounter for fitting and adjustment of other specified devices: Secondary | ICD-10-CM

## 2019-05-30 NOTE — Progress Notes (Signed)
Chief Complaint  Patient presents with  . Pessary Check    Blood pressure 128/67, pulse 77, height 5' 5.5" (1.664 m), weight 148 lb (67.1 kg).  Ruth Garrett presents today for routine follow up related to her pessary.   She uses a Milex ring with support #4 She reports no vaginal discharge or vaginal bleeding.  Exam reveals no undue vaginal mucosal pressure of breakdown, no discharge and no vaginal bleeding.  The pessary is removed, cleaned and replaced without difficulty.    Ruth Garrett will be sen back in 4 months for continued follow up.  Florian Buff, MD  05/30/2019 10:52 AM

## 2019-08-21 DIAGNOSIS — R419 Unspecified symptoms and signs involving cognitive functions and awareness: Secondary | ICD-10-CM | POA: Diagnosis not present

## 2019-08-21 DIAGNOSIS — I10 Essential (primary) hypertension: Secondary | ICD-10-CM | POA: Diagnosis not present

## 2019-08-21 DIAGNOSIS — G4700 Insomnia, unspecified: Secondary | ICD-10-CM | POA: Diagnosis not present

## 2019-09-06 DIAGNOSIS — R69 Illness, unspecified: Secondary | ICD-10-CM | POA: Diagnosis not present

## 2019-09-18 DIAGNOSIS — R69 Illness, unspecified: Secondary | ICD-10-CM | POA: Diagnosis not present

## 2019-09-20 DIAGNOSIS — R69 Illness, unspecified: Secondary | ICD-10-CM | POA: Diagnosis not present

## 2019-09-28 ENCOUNTER — Ambulatory Visit: Payer: Medicare HMO | Admitting: Obstetrics & Gynecology

## 2019-10-09 ENCOUNTER — Encounter: Payer: Self-pay | Admitting: Obstetrics & Gynecology

## 2019-10-09 ENCOUNTER — Ambulatory Visit: Payer: Medicare HMO | Admitting: Obstetrics & Gynecology

## 2019-10-09 VITALS — BP 155/77 | HR 104 | Ht 66.5 in | Wt 144.0 lb

## 2019-10-09 DIAGNOSIS — Z4689 Encounter for fitting and adjustment of other specified devices: Secondary | ICD-10-CM

## 2019-10-09 DIAGNOSIS — N812 Incomplete uterovaginal prolapse: Secondary | ICD-10-CM

## 2019-10-09 NOTE — Progress Notes (Signed)
Chief Complaint  Patient presents with  . Pessary Check    Blood pressure (!) 155/77, pulse 104, height 5' 6.5" (1.689 m), weight 144 lb (65.3 kg).  Ruth Garrett presents today for routine follow up related to her pessary.   She uses a Milex ring with support #4 She reports no vaginal discharge or vaginal bleeding.  Exam reveals no undue vaginal mucosal pressure of breakdown, no discharge and no vaginal bleeding.  The pessary is removed, cleaned and replaced without difficulty.      ICD-10-CM   1. Pessary maintenance, Milex ring with support #4, place 12/2017  Z46.89   2. Cystocele with incomplete uterovaginal prolapse  N81.2      Ruth Garrett will be sen back in 4 months for continued follow up.  Florian Buff, MD  10/09/2019 10:04 AM

## 2019-11-16 DIAGNOSIS — H5213 Myopia, bilateral: Secondary | ICD-10-CM | POA: Diagnosis not present

## 2020-01-30 ENCOUNTER — Other Ambulatory Visit (HOSPITAL_COMMUNITY): Payer: Self-pay | Admitting: Internal Medicine

## 2020-01-30 ENCOUNTER — Other Ambulatory Visit (HOSPITAL_COMMUNITY): Payer: Self-pay | Admitting: *Deleted

## 2020-01-30 DIAGNOSIS — Z1231 Encounter for screening mammogram for malignant neoplasm of breast: Secondary | ICD-10-CM

## 2020-02-07 ENCOUNTER — Ambulatory Visit (HOSPITAL_COMMUNITY)
Admission: RE | Admit: 2020-02-07 | Discharge: 2020-02-07 | Disposition: A | Discharge status: Home / Self Care | Payer: Medicare HMO | Source: Ambulatory Visit | Attending: Internal Medicine | Admitting: Internal Medicine

## 2020-02-07 ENCOUNTER — Other Ambulatory Visit: Payer: Self-pay

## 2020-02-07 DIAGNOSIS — Z1231 Encounter for screening mammogram for malignant neoplasm of breast: Secondary | ICD-10-CM | POA: Diagnosis not present

## 2020-02-08 ENCOUNTER — Encounter: Payer: Self-pay | Admitting: Adult Health

## 2020-02-08 ENCOUNTER — Other Ambulatory Visit: Payer: Self-pay

## 2020-02-08 ENCOUNTER — Ambulatory Visit: Payer: Medicare HMO | Admitting: Adult Health

## 2020-02-08 VITALS — BP 131/65 | HR 84 | Ht 66.0 in | Wt 144.5 lb

## 2020-02-08 DIAGNOSIS — Z4689 Encounter for fitting and adjustment of other specified devices: Secondary | ICD-10-CM

## 2020-02-08 DIAGNOSIS — N812 Incomplete uterovaginal prolapse: Secondary | ICD-10-CM

## 2020-02-08 NOTE — Progress Notes (Signed)
  Subjective:     Patient ID: Ruth Garrett, female   DOB: [birth year], 68 y.o.   MRN: [id number]  HPI Indria is a 69 year old black female, PM in for pessary maintenance. She has a Milex ring with support #4 PCP is Dr Nevada Crane    Review of Systems Denies any pain or vaginal bleeding  Reviewed past medical,surgical, social and family history. Reviewed medications and allergies.     Objective:   Physical Exam BP 131/65 (BP Location: Left Arm, Patient Position: Sitting, Cuff Size: Normal)   Pulse 84   Ht 5\' 6"  (1.676 m)   Wt 144 lb 8 oz (65.5 kg)   BMI 23.32 kg/m    Skin warm and dry.Pelvic: external genitalia is normal in appearance no lesions, vagina: +cystocele, has creamy discharge, no vaginal lesions or bleeding, noted after pessary was easily removed, and pessary was cleaned with soap and water and easily reinserted,urethra has no lesions or masses noted, cervix:smooth and bulbous, uterus: normal size, shape and contour, non tender, no masses felt, +uterine descensus, adnexa: no masses or tenderness noted. Bladder is non tender and no masses felt. Fall risk is low   Upstream - 02/08/20 1044      Pregnancy Intention Screening   Does the patient want to become pregnant in the next year? N/A    Does the patient's partner want to become pregnant in the next year? N/A    Would the patient like to discuss contraceptive options today? N/A      Contraception Wrap Up   Current Method Abstinence    End Method Abstinence    Contraception Counseling Provided No         Examination chaperoned by Diona Fanti CMA.  Assessment:     1. Pessary maintenance, Milex ring with support #4, place 12/2017   2. Cystocele with incomplete uterovaginal prolapse    Plan:     Return in 4 months for pessary maintenance

## 2020-02-21 DIAGNOSIS — Z0001 Encounter for general adult medical examination with abnormal findings: Secondary | ICD-10-CM | POA: Diagnosis not present

## 2020-02-21 DIAGNOSIS — E782 Mixed hyperlipidemia: Secondary | ICD-10-CM | POA: Diagnosis not present

## 2020-02-21 DIAGNOSIS — I10 Essential (primary) hypertension: Secondary | ICD-10-CM | POA: Diagnosis not present

## 2020-02-21 DIAGNOSIS — Z0000 Encounter for general adult medical examination without abnormal findings: Secondary | ICD-10-CM | POA: Diagnosis not present

## 2020-02-26 DIAGNOSIS — N812 Incomplete uterovaginal prolapse: Secondary | ICD-10-CM | POA: Diagnosis not present

## 2020-02-26 DIAGNOSIS — I10 Essential (primary) hypertension: Secondary | ICD-10-CM | POA: Diagnosis not present

## 2020-02-26 DIAGNOSIS — Z23 Encounter for immunization: Secondary | ICD-10-CM | POA: Diagnosis not present

## 2020-02-26 DIAGNOSIS — Z0001 Encounter for general adult medical examination with abnormal findings: Secondary | ICD-10-CM | POA: Diagnosis not present

## 2020-02-26 DIAGNOSIS — E782 Mixed hyperlipidemia: Secondary | ICD-10-CM | POA: Diagnosis not present

## 2020-06-10 ENCOUNTER — Ambulatory Visit (INDEPENDENT_AMBULATORY_CARE_PROVIDER_SITE_OTHER): Payer: Medicare HMO | Admitting: Obstetrics & Gynecology

## 2020-06-10 ENCOUNTER — Encounter: Payer: Self-pay | Admitting: Obstetrics & Gynecology

## 2020-06-10 ENCOUNTER — Other Ambulatory Visit: Payer: Self-pay

## 2020-06-10 VITALS — BP 129/70 | HR 78 | Ht 66.0 in | Wt 146.0 lb

## 2020-06-10 DIAGNOSIS — Z4689 Encounter for fitting and adjustment of other specified devices: Secondary | ICD-10-CM

## 2020-06-10 DIAGNOSIS — N812 Incomplete uterovaginal prolapse: Secondary | ICD-10-CM

## 2020-06-10 NOTE — Progress Notes (Signed)
Chief Complaint  Patient presents with  . Pessary Check    Blood pressure 129/70, pulse 78, height 5\' 6"  (1.676 m), weight 146 lb (66.2 kg).  Ruth Garrett presents today for routine follow up related to her pessary.   She uses a Milex ring with support #4 She reports no vaginal discharge and no vaginal bleeding   Likert scale(1 not bothersome -5 very bothersome)  :  1  Exam reveals no undue vaginal mucosal pressure of breakdown, no discharge and no vaginal bleeding.  Vaginal Epithelial Abnormality Classification System:   0 0    No abnormalities 1    Epithelial erythema 2    Granulation tissue 3    Epithelial break or erosion, 1 cm or less 4    Epithelial break or erosion, 1 cm or greater  The pessary is removed, cleaned and replaced without difficulty.      ICD-10-CM   1. Pessary maintenance, Milex ring with support #4, place 12/2017  Z46.89   2. Cystocele with incomplete uterovaginal prolapse  N81.2      Redina Zeller will be sen back in 4 months for continued follow up.  Florian Buff, MD  06/10/2020 10:45 AM

## 2020-08-26 DIAGNOSIS — I10 Essential (primary) hypertension: Secondary | ICD-10-CM | POA: Diagnosis not present

## 2020-09-03 ENCOUNTER — Encounter: Payer: Self-pay | Admitting: Internal Medicine

## 2020-10-10 ENCOUNTER — Other Ambulatory Visit: Payer: Self-pay

## 2020-10-10 ENCOUNTER — Encounter: Payer: Self-pay | Admitting: Obstetrics & Gynecology

## 2020-10-10 ENCOUNTER — Ambulatory Visit: Payer: Medicare HMO | Admitting: Obstetrics & Gynecology

## 2020-10-10 VITALS — BP 132/66 | HR 67 | Ht 62.0 in | Wt 146.8 lb

## 2020-10-10 DIAGNOSIS — N812 Incomplete uterovaginal prolapse: Secondary | ICD-10-CM | POA: Diagnosis not present

## 2020-10-10 DIAGNOSIS — Z4689 Encounter for fitting and adjustment of other specified devices: Secondary | ICD-10-CM | POA: Diagnosis not present

## 2020-10-10 NOTE — Progress Notes (Signed)
Chief Complaint  Patient presents with   Pessary Check    Blood pressure 132/66, pulse 67, height '5\' 2"'$  (1.575 m), weight 146 lb 12.8 oz (66.6 kg).  Ruth Garrett presents today for routine follow up related to her pessary.   She uses a Tax adviser with support #4 She reports no vaginal discharge and no vaginal bleeding   Likert scale(1 not bothersome -5 very bothersome)  :  1  Exam reveals no undue vaginal mucosal pressure of breakdown, no discharge and no vaginal bleeding.  Vaginal Epithelial Abnormality Classification System:   0 0    No abnormalities 1    Epithelial erythema 2    Granulation tissue 3    Epithelial break or erosion, 1 cm or less 4    Epithelial break or erosion, 1 cm or greater  The pessary is removed, cleaned and replaced without difficulty.      ICD-10-CM   1. Pessary maintenance, Milex ring with support #4, place 12/2017  Z46.89     2. Cystocele with incomplete uterovaginal prolapse  N81.2        Ruth Garrett will be sen back in 4 months for continued follow up.  Florian Buff, MD  10/10/2020 10:52 AM

## 2020-12-12 DIAGNOSIS — Z0100 Encounter for examination of eyes and vision without abnormal findings: Secondary | ICD-10-CM | POA: Diagnosis not present

## 2021-02-03 ENCOUNTER — Other Ambulatory Visit (HOSPITAL_COMMUNITY): Payer: Self-pay | Admitting: Internal Medicine

## 2021-02-03 DIAGNOSIS — Z1231 Encounter for screening mammogram for malignant neoplasm of breast: Secondary | ICD-10-CM

## 2021-02-10 ENCOUNTER — Encounter: Payer: Self-pay | Admitting: Obstetrics & Gynecology

## 2021-02-10 ENCOUNTER — Ambulatory Visit: Payer: Medicare HMO | Admitting: Obstetrics & Gynecology

## 2021-02-10 ENCOUNTER — Other Ambulatory Visit: Payer: Self-pay

## 2021-02-10 VITALS — BP 122/53 | HR 74 | Ht 66.0 in | Wt 149.0 lb

## 2021-02-10 DIAGNOSIS — Z4689 Encounter for fitting and adjustment of other specified devices: Secondary | ICD-10-CM | POA: Diagnosis not present

## 2021-02-10 DIAGNOSIS — N812 Incomplete uterovaginal prolapse: Secondary | ICD-10-CM | POA: Diagnosis not present

## 2021-02-10 NOTE — Progress Notes (Signed)
Chief Complaint  Patient presents with   Pessary Check    Blood pressure (!) 122/53, pulse 74, height 5\' 6"  (1.676 m), weight 149 lb (67.6 kg).  Ruth Garrett presents today for routine follow up related to her pessary.   She uses a Milex ring with suppor #4 She reports no vaginal discharge and no vaginal bleeding   Likert scale(1 not bothersome -5 very bothersome)  :  1  Exam reveals no undue vaginal mucosal pressure of breakdown, no discharge and no vaginal bleeding.  Vaginal Epithelial Abnormality Classification System:   0 0    No abnormalities 1    Epithelial erythema 2    Granulation tissue 3    Epithelial break or erosion, 1 cm or less 4    Epithelial break or erosion, 1 cm or greater  The pessary is removed, cleaned and replaced without difficulty.      ICD-10-CM   1. Pessary maintenance, Milex ring with support #4, place 12/2017  Z46.89     2. Cystocele with incomplete uterovaginal prolapse  N81.2        Bijal Siglin will be sen back in 4 months for continued follow up.  Florian Buff, MD  02/10/2021 10:40 AM

## 2021-02-12 ENCOUNTER — Ambulatory Visit (HOSPITAL_COMMUNITY)
Admission: RE | Admit: 2021-02-12 | Discharge: 2021-02-12 | Disposition: A | Discharge status: Home / Self Care | Payer: Medicare HMO | Source: Ambulatory Visit | Attending: Internal Medicine | Admitting: Internal Medicine

## 2021-02-12 ENCOUNTER — Other Ambulatory Visit: Payer: Self-pay

## 2021-02-12 DIAGNOSIS — Z1231 Encounter for screening mammogram for malignant neoplasm of breast: Secondary | ICD-10-CM | POA: Insufficient documentation

## 2021-03-12 DIAGNOSIS — I10 Essential (primary) hypertension: Secondary | ICD-10-CM | POA: Diagnosis not present

## 2021-03-15 DIAGNOSIS — I10 Essential (primary) hypertension: Secondary | ICD-10-CM | POA: Diagnosis not present

## 2021-03-15 DIAGNOSIS — E782 Mixed hyperlipidemia: Secondary | ICD-10-CM | POA: Diagnosis not present

## 2021-03-15 DIAGNOSIS — Z0001 Encounter for general adult medical examination with abnormal findings: Secondary | ICD-10-CM | POA: Diagnosis not present

## 2021-03-15 DIAGNOSIS — Z23 Encounter for immunization: Secondary | ICD-10-CM | POA: Diagnosis not present

## 2021-03-15 DIAGNOSIS — N8110 Cystocele, unspecified: Secondary | ICD-10-CM | POA: Diagnosis not present

## 2021-06-10 ENCOUNTER — Encounter: Payer: Self-pay | Admitting: Obstetrics & Gynecology

## 2021-06-10 ENCOUNTER — Ambulatory Visit: Payer: Medicare HMO | Admitting: Obstetrics & Gynecology

## 2021-06-10 VITALS — BP 122/69 | HR 72 | Ht 66.0 in | Wt 143.0 lb

## 2021-06-10 DIAGNOSIS — Z4689 Encounter for fitting and adjustment of other specified devices: Secondary | ICD-10-CM | POA: Diagnosis not present

## 2021-06-10 NOTE — Progress Notes (Signed)
Chief Complaint  ?Patient presents with  ? Pessary Check  ? ? ?Blood pressure 122/69, pulse 72, height '5\' 6"'$  (1.676 m), weight 143 lb (64.9 kg). ? ?Ruth Garrett presents today for routine follow up related to her pessary.   ?She uses a Milex ring with support #4 ?She reports no vaginal discharge and no vaginal bleeding  ? ?Likert scale(1 not bothersome -5 very bothersome)  :  1 ? ?Exam reveals no undue vaginal mucosal pressure of breakdown, no discharge and no vaginal bleeding. ? ?Vaginal Epithelial Abnormality Classification System:   0 ?0    No abnormalities ?1    Epithelial erythema ?2    Granulation tissue ?3    Epithelial break or erosion, 1 cm or less ?4    Epithelial break or erosion, 1 cm or greater ? ?The pessary is removed, cleaned and replaced without difficulty.   ? ?  ICD-10-CM   ?1. Pessary maintenance, Milex ring with support #4, place 12/2017  Z46.89   ?  ?  ? ?Ruth Garrett will be sen back in 4 months for continued follow up. ? ?Florian Buff, MD  ?06/10/2021 ?10:45 AM ? ? ? ?

## 2021-07-14 IMAGING — MG DIGITAL SCREENING BILATERAL MAMMOGRAM WITH TOMO AND CAD
8 series · 8 of 24 positions shown · non-contrast
Comparison: Previous exam(s).

CLINICAL DATA: Screening.

EXAM:
DIGITAL SCREENING BILATERAL MAMMOGRAM WITH TOMO AND CAD

[R MLO synth-2D]
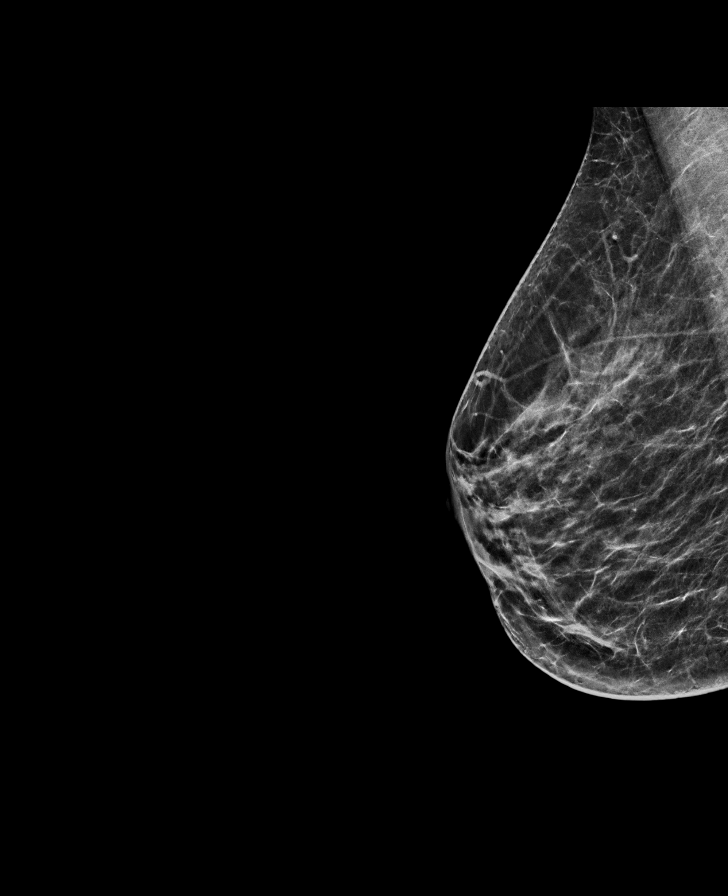

[L MLO synth-2D]
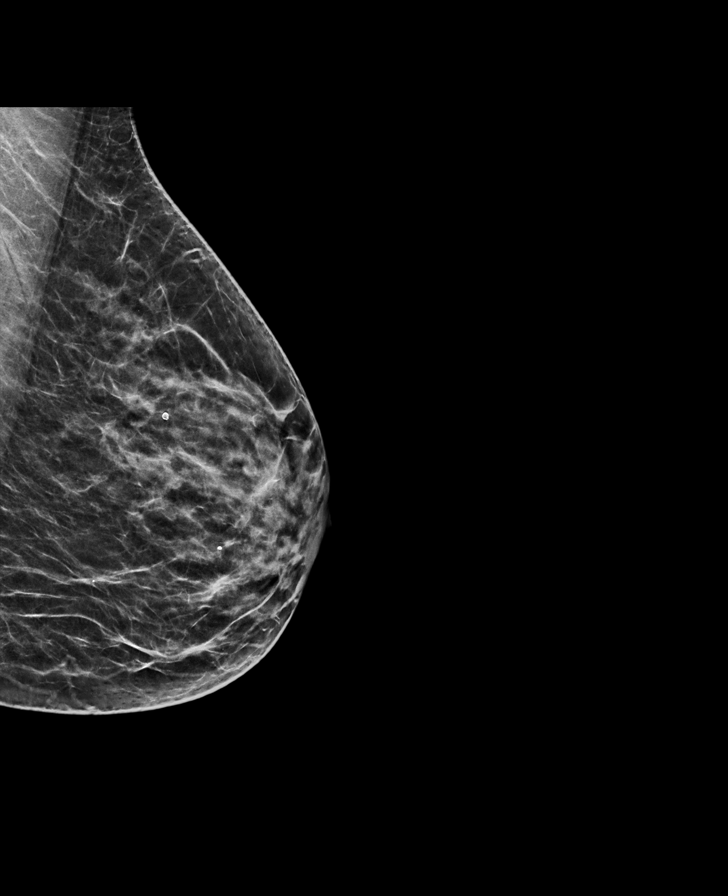

[L CC synth-2D]
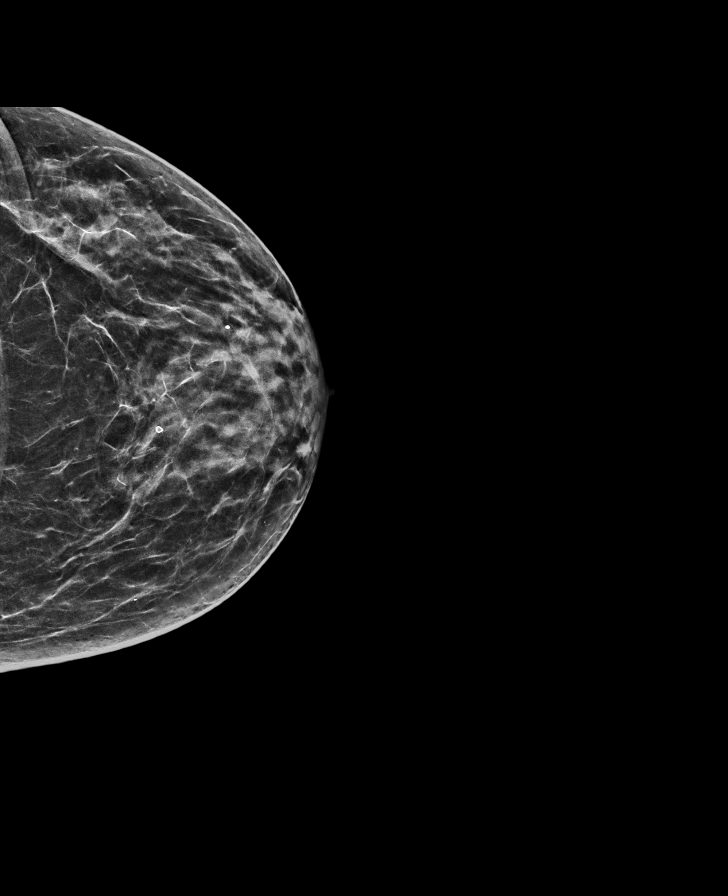

[R CC synth-2D]
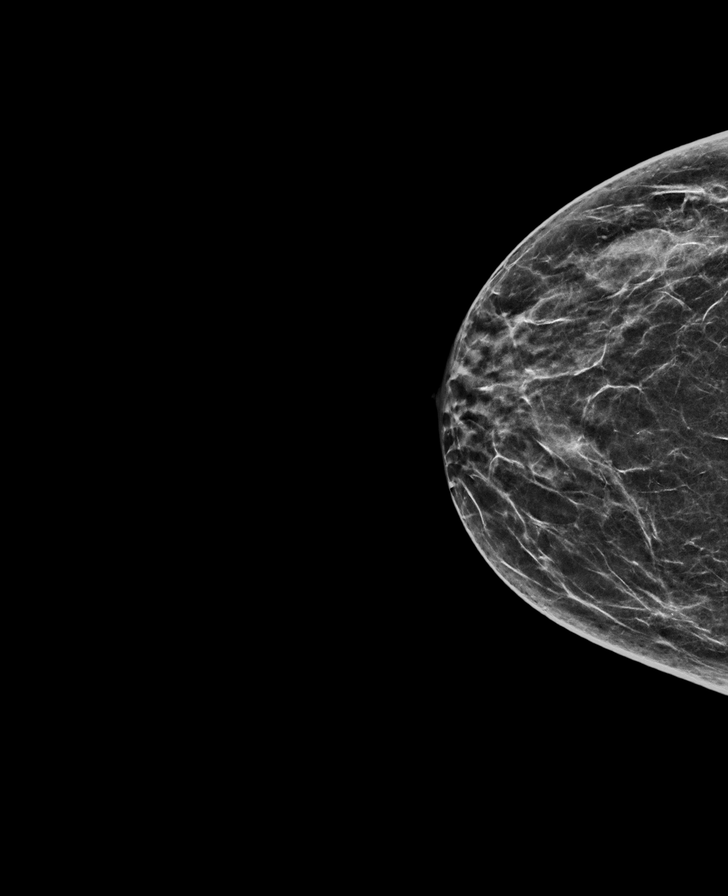

[R MLO tomo · tomo slice 29/58.0]
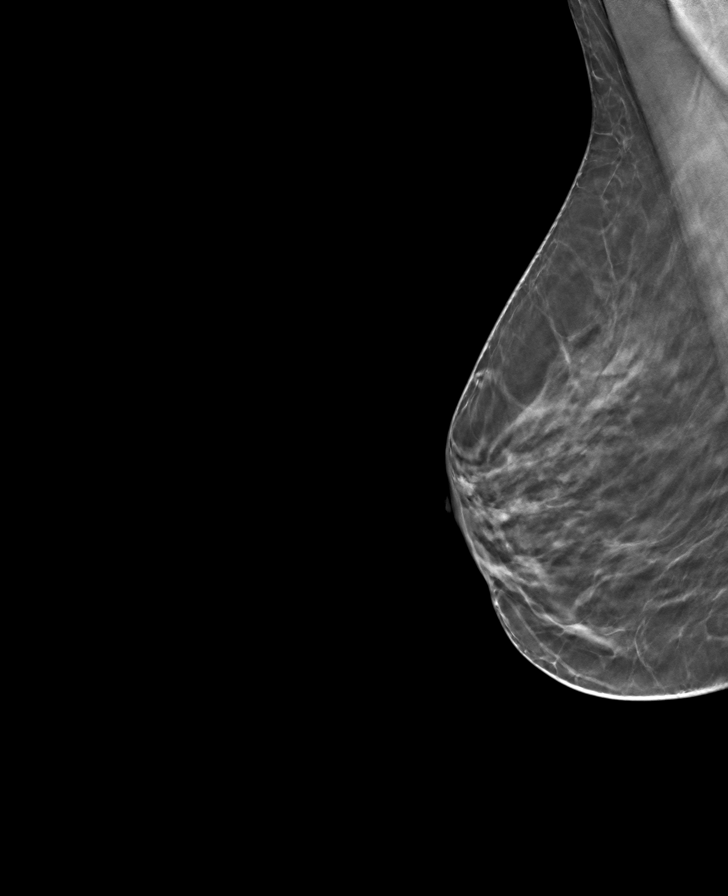

[R CC tomo · tomo slice 29/56.0]
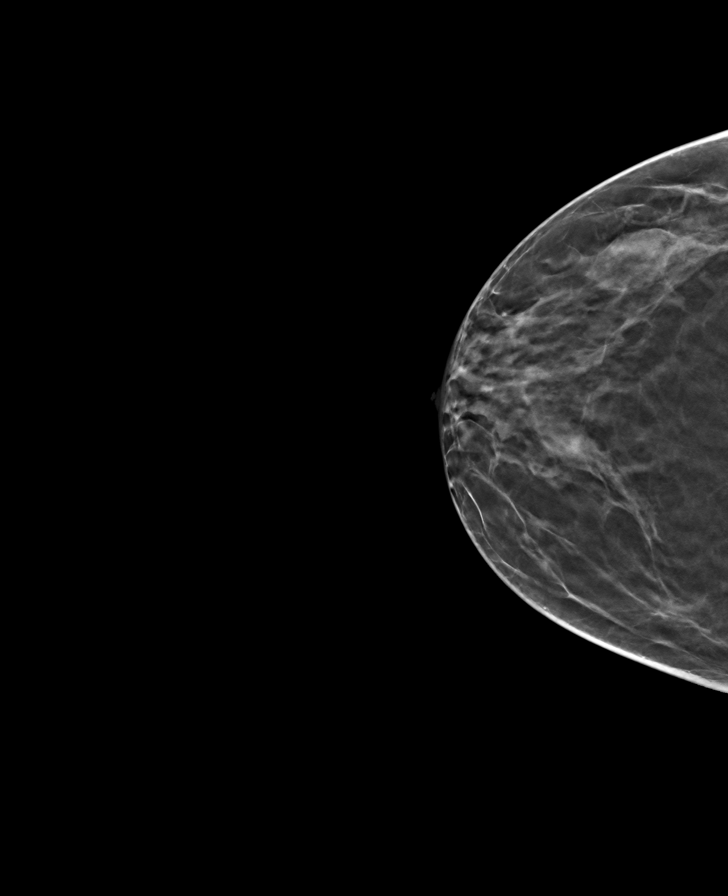

[L MLO tomo · tomo slice 31/60.0]
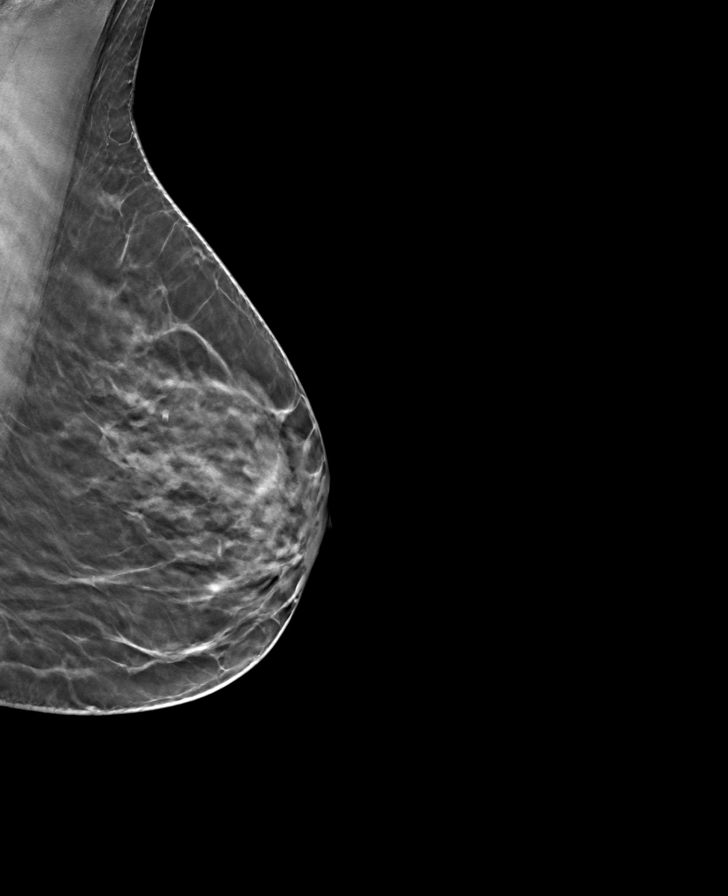

[L CC tomo · tomo slice 30/59.0]
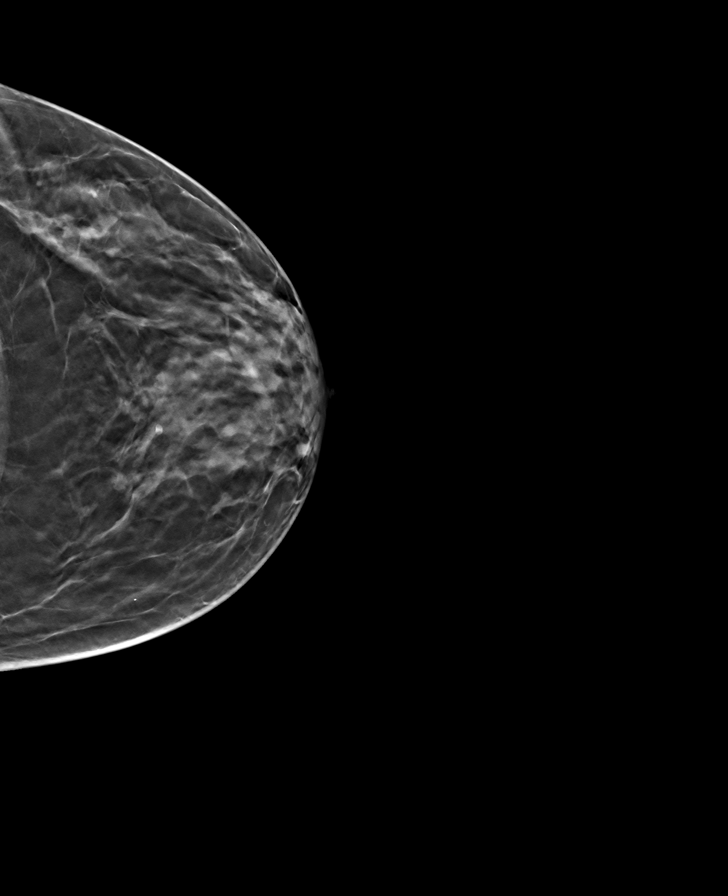

[8 of 24 positions shown; findings below may reference images not displayed]

ACR Breast Density Category c: The breast tissue is heterogeneously
dense, which may obscure small masses.
FINDINGS: There are no findings suspicious for malignancy. Images were
processed with CAD.
IMPRESSION: No mammographic evidence of malignancy. A result letter of this
screening mammogram will be mailed directly to the patient.

RECOMMENDATION:
Screening mammogram in one year. (Code:FT-[id number])

BI-RADS CATEGORY  1: Negative.

## 2021-08-13 ENCOUNTER — Encounter: Payer: Self-pay | Admitting: Obstetrics & Gynecology

## 2021-09-13 DIAGNOSIS — I10 Essential (primary) hypertension: Secondary | ICD-10-CM | POA: Diagnosis not present

## 2021-10-09 ENCOUNTER — Encounter: Payer: Self-pay | Admitting: Obstetrics & Gynecology

## 2021-10-09 ENCOUNTER — Ambulatory Visit: Payer: Medicare HMO | Admitting: Obstetrics & Gynecology

## 2021-10-09 VITALS — BP 107/64 | HR 65 | Ht 66.0 in | Wt 144.0 lb

## 2021-10-09 DIAGNOSIS — N812 Incomplete uterovaginal prolapse: Secondary | ICD-10-CM

## 2021-10-09 DIAGNOSIS — Z4689 Encounter for fitting and adjustment of other specified devices: Secondary | ICD-10-CM

## 2021-10-09 NOTE — Progress Notes (Signed)
No chief complaint on file.   Blood pressure 107/64, pulse 65, height '5\' 6"'$  (1.676 m), weight 144 lb (65.3 kg).  Ruth Garrett presents today for routine follow up related to her pessary.   She uses a Milex ring with support #4 She reports no vaginal discharge and no vaginal bleeding   Likert scale(1 not bothersome -5 very bothersome)  :  1  Exam reveals no undue vaginal mucosal pressure of breakdown, no discharge and no vaginal bleeding.  Vaginal Epithelial Abnormality Classification System:   0 0    No abnormalities 1    Epithelial erythema 2    Granulation tissue 3    Epithelial break or erosion, 1 cm or less 4    Epithelial break or erosion, 1 cm or greater  The pessary is removed, cleaned and replaced without difficulty.      ICD-10-CM   1. Pessary maintenance, Milex ring with support #4, place 12/2017  Z46.89     2. Cystocele with incomplete uterovaginal prolapse  N81.2        Ruth Garrett will be sen back in 4 months for continued follow up.  Florian Buff, MD  10/09/2021 11:16 AM

## 2021-10-14 ENCOUNTER — Ambulatory Visit: Payer: Medicare HMO | Admitting: Obstetrics & Gynecology

## 2022-02-09 ENCOUNTER — Encounter: Payer: Self-pay | Admitting: Obstetrics & Gynecology

## 2022-02-09 ENCOUNTER — Ambulatory Visit: Payer: Medicare HMO | Admitting: Obstetrics & Gynecology

## 2022-02-09 VITALS — BP 130/67 | HR 85 | Ht 66.5 in | Wt 148.0 lb

## 2022-02-09 DIAGNOSIS — N812 Incomplete uterovaginal prolapse: Secondary | ICD-10-CM

## 2022-02-09 DIAGNOSIS — Z4689 Encounter for fitting and adjustment of other specified devices: Secondary | ICD-10-CM | POA: Diagnosis not present

## 2022-02-09 NOTE — Progress Notes (Signed)
Chief Complaint  Patient presents with   pessary maintenance    Blood pressure 130/67, pulse 85, height 5' 6.5" (1.689 m), weight 148 lb (67.1 kg).  Ruth Garrett presents today for routine follow up related to her pessary.   She uses a Milex ring with support #4 She reports no vaginal discharge and no vaginal bleeding   Likert scale(1 not bothersome -5 very bothersome)  :  1  Exam reveals no undue vaginal mucosal pressure of breakdown, no discharge and no vaginal bleeding.  Vaginal Epithelial Abnormality Classification System:   0 0    No abnormalities 1    Epithelial erythema 2    Granulation tissue 3    Epithelial break or erosion, 1 cm or less 4    Epithelial break or erosion, 1 cm or greater  The pessary is removed, cleaned and replaced without difficulty.      ICD-10-CM   1. Pessary maintenance, Milex ring with support #4, place 12/2017  Z46.89     2. Cystocele with incomplete uterovaginal prolapse  N81.2        Kristin Barcus will be sen back in 4 months for continued follow up.  Florian Buff, MD  02/09/2022 10:07 AM

## 2022-02-11 ENCOUNTER — Other Ambulatory Visit (HOSPITAL_COMMUNITY): Payer: Self-pay | Admitting: Internal Medicine

## 2022-02-11 DIAGNOSIS — Z1231 Encounter for screening mammogram for malignant neoplasm of breast: Secondary | ICD-10-CM

## 2022-02-18 ENCOUNTER — Ambulatory Visit (HOSPITAL_COMMUNITY)
Admission: RE | Admit: 2022-02-18 | Discharge: 2022-02-18 | Disposition: A | Discharge status: Home / Self Care | Payer: Medicare HMO | Source: Ambulatory Visit | Attending: Internal Medicine | Admitting: Internal Medicine

## 2022-02-18 DIAGNOSIS — Z1231 Encounter for screening mammogram for malignant neoplasm of breast: Secondary | ICD-10-CM | POA: Insufficient documentation

## 2022-02-23 DIAGNOSIS — H52229 Regular astigmatism, unspecified eye: Secondary | ICD-10-CM | POA: Diagnosis not present

## 2022-02-23 DIAGNOSIS — H5213 Myopia, bilateral: Secondary | ICD-10-CM | POA: Diagnosis not present

## 2022-02-23 DIAGNOSIS — H524 Presbyopia: Secondary | ICD-10-CM | POA: Diagnosis not present

## 2022-03-17 DIAGNOSIS — E782 Mixed hyperlipidemia: Secondary | ICD-10-CM | POA: Diagnosis not present

## 2022-03-19 DIAGNOSIS — Z0000 Encounter for general adult medical examination without abnormal findings: Secondary | ICD-10-CM | POA: Diagnosis not present

## 2022-03-19 DIAGNOSIS — N8110 Cystocele, unspecified: Secondary | ICD-10-CM | POA: Diagnosis not present

## 2022-03-19 DIAGNOSIS — Z6824 Body mass index (BMI) 24.0-24.9, adult: Secondary | ICD-10-CM | POA: Diagnosis not present

## 2022-03-19 DIAGNOSIS — E782 Mixed hyperlipidemia: Secondary | ICD-10-CM | POA: Diagnosis not present

## 2022-03-19 DIAGNOSIS — I10 Essential (primary) hypertension: Secondary | ICD-10-CM | POA: Diagnosis not present

## 2022-03-19 DIAGNOSIS — Z79899 Other long term (current) drug therapy: Secondary | ICD-10-CM | POA: Diagnosis not present

## 2022-03-21 DIAGNOSIS — Z0100 Encounter for examination of eyes and vision without abnormal findings: Secondary | ICD-10-CM | POA: Diagnosis not present

## 2022-06-11 ENCOUNTER — Ambulatory Visit: Payer: Medicare HMO | Admitting: Obstetrics & Gynecology

## 2022-06-11 ENCOUNTER — Encounter: Payer: Self-pay | Admitting: Obstetrics & Gynecology

## 2022-06-11 VITALS — BP 128/67 | HR 71 | Ht 66.5 in | Wt 148.0 lb

## 2022-06-11 DIAGNOSIS — Z4689 Encounter for fitting and adjustment of other specified devices: Secondary | ICD-10-CM

## 2022-06-11 DIAGNOSIS — N812 Incomplete uterovaginal prolapse: Secondary | ICD-10-CM

## 2022-06-11 NOTE — Progress Notes (Signed)
Chief Complaint  Patient presents with   Pessary Check    Blood pressure 128/67, pulse 71, height 5' 6.5" (1.689 m), weight 148 lb (67.1 kg).  Ruth Garrett presents today for routine follow up related to her pessary.   She uses a Milex ring with support #4 She reports no vaginal discharge and no vaginal bleeding   Likert scale(1 not bothersome -5 very bothersome)  :  1  Exam reveals no undue vaginal mucosal pressure of breakdown, no discharge and no vaginal bleeding.  Vaginal Epithelial Abnormality Classification System:   0 0    No abnormalities 1    Epithelial erythema 2    Granulation tissue 3    Epithelial break or erosion, 1 cm or less 4    Epithelial break or erosion, 1 cm or greater  The pessary is removed, cleaned and replaced without difficulty.      ICD-10-CM   1. Pessary maintenance, Milex ring with support #4, place 12/2017  Z46.89     2. Cystocele with incomplete uterovaginal prolapse  N81.2        [name] will be sen back in 4 months for continued follow up.  Florian Buff, MD  06/11/2022 10:10 AM

## 2022-10-22 ENCOUNTER — Ambulatory Visit: Payer: Medicare HMO | Admitting: Obstetrics & Gynecology

## 2022-10-22 ENCOUNTER — Encounter: Payer: Self-pay | Admitting: Obstetrics & Gynecology

## 2022-10-22 VITALS — BP 115/60 | HR 68 | Ht 66.0 in | Wt 148.0 lb

## 2022-10-22 DIAGNOSIS — N812 Incomplete uterovaginal prolapse: Secondary | ICD-10-CM | POA: Diagnosis not present

## 2022-10-22 DIAGNOSIS — Z4689 Encounter for fitting and adjustment of other specified devices: Secondary | ICD-10-CM

## 2022-10-22 NOTE — Progress Notes (Signed)
Chief Complaint  Patient presents with   Pessary Check    Blood pressure 115/60, pulse 68, height 5\' 6"  (1.676 m), weight 148 lb (67.1 kg).  Ruth Garrett presents today for routine follow up related to her pessary.   She uses a Milex ring with support #4 She reports no vaginal discharge and no vaginal bleeding   Likert scale(1 not bothersome -5 very bothersome)  :  1  Exam reveals no undue vaginal mucosal pressure of breakdown, no discharge and no vaginal bleeding.  Vaginal Epithelial Abnormality Classification System:   0 0    No abnormalities 1    Epithelial erythema 2    Granulation tissue 3    Epithelial break or erosion, 1 cm or less 4    Epithelial break or erosion, 1 cm or greater  The pessary is removed, cleaned and replaced without difficulty.      ICD-10-CM   1. Pessary maintenance, Milex ring with support #4, place 12/2017  Z46.89     2. Cystocele with incomplete uterovaginal prolapse  N81.2        [name] will be sen back in 4 months for continued follow up.  Lazaro Arms, MD  10/22/2022 10:50 AM

## 2023-01-11 ENCOUNTER — Other Ambulatory Visit (HOSPITAL_COMMUNITY): Payer: Self-pay | Admitting: Internal Medicine

## 2023-01-11 DIAGNOSIS — Z1231 Encounter for screening mammogram for malignant neoplasm of breast: Secondary | ICD-10-CM

## 2023-02-22 ENCOUNTER — Ambulatory Visit (HOSPITAL_COMMUNITY)
Admission: RE | Admit: 2023-02-22 | Discharge: 2023-02-22 | Disposition: A | Discharge status: Home / Self Care | Payer: Medicare HMO | Source: Ambulatory Visit | Attending: Internal Medicine | Admitting: Internal Medicine

## 2023-02-22 DIAGNOSIS — Z1231 Encounter for screening mammogram for malignant neoplasm of breast: Secondary | ICD-10-CM | POA: Diagnosis not present

## 2023-02-25 ENCOUNTER — Ambulatory Visit: Payer: Medicare HMO | Admitting: Obstetrics & Gynecology

## 2023-02-25 ENCOUNTER — Encounter: Payer: Self-pay | Admitting: Obstetrics & Gynecology

## 2023-02-25 VITALS — BP 130/68 | HR 76 | Ht 66.0 in | Wt 155.0 lb

## 2023-02-25 DIAGNOSIS — Z4689 Encounter for fitting and adjustment of other specified devices: Secondary | ICD-10-CM

## 2023-02-25 DIAGNOSIS — N812 Incomplete uterovaginal prolapse: Secondary | ICD-10-CM

## 2023-02-25 NOTE — Progress Notes (Signed)
Chief Complaint  Patient presents with   Pessary Check    Blood pressure 130/68, pulse 76, height 5\' 6"  (1.676 m), weight 155 lb (70.3 kg).  Ruth Garrett presents today for routine follow up related to her pessary.   She uses a Milex ring with support #4 She reports no vaginal discharge and no vaginal bleeding   Likert scale(1 not bothersome -5 very bothersome)  :  1  Exam reveals no undue vaginal mucosal pressure of breakdown, no discharge and no vaginal bleeding.  Vaginal Epithelial Abnormality Classification System:   0 0    No abnormalities 1    Epithelial erythema 2    Granulation tissue 3    Epithelial break or erosion, 1 cm or less 4    Epithelial break or erosion, 1 cm or greater  The pessary is removed, cleaned and replaced without difficulty.      ICD-10-CM   1. Pessary maintenance, Milex ring with support #4, place 12/2017  Z46.89     2. Cystocele with incomplete uterovaginal prolapse  N81.2        [name] will be sen back in 4 months for continued follow up.  Lazaro Arms, MD  02/25/2023 9:55 AM

## 2023-03-26 DIAGNOSIS — I10 Essential (primary) hypertension: Secondary | ICD-10-CM | POA: Diagnosis not present

## 2023-04-05 ENCOUNTER — Other Ambulatory Visit (HOSPITAL_COMMUNITY): Payer: Self-pay | Admitting: Nurse Practitioner

## 2023-04-05 DIAGNOSIS — Z13820 Encounter for screening for osteoporosis: Secondary | ICD-10-CM

## 2023-04-12 ENCOUNTER — Ambulatory Visit (HOSPITAL_COMMUNITY)
Admission: RE | Admit: 2023-04-12 | Discharge: 2023-04-12 | Disposition: A | Discharge status: Home / Self Care | Payer: Medicare HMO | Source: Ambulatory Visit | Attending: Nurse Practitioner | Admitting: Nurse Practitioner

## 2023-04-12 DIAGNOSIS — Z13820 Encounter for screening for osteoporosis: Secondary | ICD-10-CM | POA: Insufficient documentation

## 2023-04-12 DIAGNOSIS — M8588 Other specified disorders of bone density and structure, other site: Secondary | ICD-10-CM | POA: Diagnosis not present

## 2023-04-12 DIAGNOSIS — Z780 Asymptomatic menopausal state: Secondary | ICD-10-CM | POA: Insufficient documentation

## 2023-06-24 ENCOUNTER — Ambulatory Visit: Admitting: Obstetrics & Gynecology

## 2023-06-24 ENCOUNTER — Encounter: Payer: Self-pay | Admitting: Obstetrics & Gynecology

## 2023-06-24 VITALS — BP 121/77 | HR 78 | Ht 66.0 in | Wt 146.0 lb

## 2023-06-24 DIAGNOSIS — Z4689 Encounter for fitting and adjustment of other specified devices: Secondary | ICD-10-CM

## 2023-06-24 DIAGNOSIS — N812 Incomplete uterovaginal prolapse: Secondary | ICD-10-CM | POA: Diagnosis not present

## 2023-06-24 NOTE — Progress Notes (Signed)
 Chief Complaint  Patient presents with   Pessary Check    Blood pressure 121/77, pulse 78, height 5\' 6"  (1.676 m), weight 146 lb (66.2 kg).  Ruth Garrett presents today for routine follow up related to her pessary.   She uses a Milex ring with support #4 She reports no vaginal discharge and no vaginal bleeding   Likert scale(1 not bothersome -5 very bothersome)  :  1  Exam reveals no undue vaginal mucosal pressure of breakdown, no discharge and no vaginal bleeding.  Vaginal Epithelial Abnormality Classification System:   0 0    No abnormalities 1    Epithelial erythema 2    Granulation tissue 3    Epithelial break or erosion, 1 cm or less 4    Epithelial break or erosion, 1 cm or greater  The pessary is removed, cleaned and replaced without difficulty.      ICD-10-CM   1. Pessary maintenance, Milex ring with support #4, place 12/2017  Z46.89     2. Cystocele with incomplete uterovaginal prolapse  N81.2        Ruth Garrett will be sen back in 4 months for continued follow up.  Wendelyn Halter, MD  06/24/2023 10:15 AM

## 2023-06-30 DIAGNOSIS — I10 Essential (primary) hypertension: Secondary | ICD-10-CM | POA: Diagnosis not present

## 2023-07-07 DIAGNOSIS — M8580 Other specified disorders of bone density and structure, unspecified site: Secondary | ICD-10-CM | POA: Diagnosis not present

## 2023-07-07 DIAGNOSIS — N8110 Cystocele, unspecified: Secondary | ICD-10-CM | POA: Diagnosis not present

## 2023-07-07 DIAGNOSIS — E782 Mixed hyperlipidemia: Secondary | ICD-10-CM | POA: Diagnosis not present

## 2023-07-07 DIAGNOSIS — I10 Essential (primary) hypertension: Secondary | ICD-10-CM | POA: Diagnosis not present

## 2023-07-07 DIAGNOSIS — Z79899 Other long term (current) drug therapy: Secondary | ICD-10-CM | POA: Diagnosis not present

## 2023-07-07 DIAGNOSIS — Z6824 Body mass index (BMI) 24.0-24.9, adult: Secondary | ICD-10-CM | POA: Diagnosis not present

## 2023-10-12 DIAGNOSIS — H5213 Myopia, bilateral: Secondary | ICD-10-CM | POA: Diagnosis not present

## 2023-10-26 ENCOUNTER — Ambulatory Visit: Admitting: Obstetrics & Gynecology

## 2023-10-28 ENCOUNTER — Ambulatory Visit: Admitting: Obstetrics & Gynecology

## 2023-10-28 ENCOUNTER — Ambulatory Visit: Admitting: Adult Health

## 2023-10-28 ENCOUNTER — Encounter: Payer: Self-pay | Admitting: Adult Health

## 2023-10-28 VITALS — BP 122/68 | HR 83 | Ht 66.0 in | Wt 143.5 lb

## 2023-10-28 DIAGNOSIS — Z4689 Encounter for fitting and adjustment of other specified devices: Secondary | ICD-10-CM

## 2023-10-28 DIAGNOSIS — N812 Incomplete uterovaginal prolapse: Secondary | ICD-10-CM | POA: Diagnosis not present

## 2023-10-28 NOTE — Progress Notes (Signed)
  Subjective:     Patient ID: Ruth Garrett, female   DOB: [birth year], 73 y.o.   MRN: [id number]  HPI Ruth Garrett is a 73 year old black female,single, PM in for pessary maintenance.  PCP is Dr Shona   Review of Systems For pessary maintenance She denies any vaginal bleeding or discharge Reviewed past medical,surgical, social and family history. Reviewed medications and allergies.     Objective:   Physical Exam BP 122/68 (BP Location: Right Arm, Patient Position: Sitting, Cuff Size: Normal)   Pulse 83   Ht 5' 6 (1.676 m)   Wt 143 lb 8 oz (65.1 kg)   BMI 23.16 kg/m   Skin warm and dry.Pelvic: external genitalia is normal in appearance no lesions, vagina is pale, milex ring #4 with support removed and washed with soap and water, no irritation, or bleeding,slight odor, +prolapse, pessary easily reinserted. Examination chaperoned by Clarita Salt LPN  Fall risk is low  Upstream - 10/28/23 1054       Pregnancy Intention Screening   Does the patient want to become pregnant in the next year? N/A    Does the patient's partner want to become pregnant in the next year? N/A    Would the patient like to discuss contraceptive options today? N/A      Contraception Wrap Up   Current Method Abstinence;Female Sterilization   PM   End Method Abstinence;Female Sterilization   PM   Contraception Counseling Provided No    How was the end contraceptive method provided? N/A             Assessment:     1. Pessary maintenance, Milex ring with support #4, place 12/2017 (Primary) Cleaned and reinserted   2. Cystocele with incomplete uterovaginal prolapse    Plan:     Follow up for pessary maintenance in 4 months or sooner if needed

## 2024-01-14 ENCOUNTER — Other Ambulatory Visit (HOSPITAL_COMMUNITY): Payer: Self-pay | Admitting: Internal Medicine

## 2024-01-14 DIAGNOSIS — Z1231 Encounter for screening mammogram for malignant neoplasm of breast: Secondary | ICD-10-CM

## 2024-02-24 ENCOUNTER — Ambulatory Visit: Admitting: Adult Health

## 2024-02-24 ENCOUNTER — Encounter: Payer: Self-pay | Admitting: Adult Health

## 2024-02-24 VITALS — BP 127/60 | HR 69 | Ht 66.0 in | Wt 151.0 lb

## 2024-02-24 DIAGNOSIS — N812 Incomplete uterovaginal prolapse: Secondary | ICD-10-CM | POA: Diagnosis not present

## 2024-02-24 DIAGNOSIS — Z4689 Encounter for fitting and adjustment of other specified devices: Secondary | ICD-10-CM

## 2024-02-24 NOTE — Progress Notes (Signed)
°  Subjective:     Patient ID: Ruth Garrett, female   DOB: [birth year], 73 y.o.   MRN: [id number]  HPI Ruth Garrett is a 73 year old black female,single,PM in for pessary maintenance.  PCP is Dr Shona  Review of Systems For pessary maintenance Denies any vaginal bleeding, discharge or odor  Reviewed past medical,surgical, social and family history. Reviewed medications and allergies.     Objective:   Physical Exam BP 127/60 (BP Location: Left Arm, Patient Position: Sitting, Cuff Size: Normal)   Pulse 69   Ht 5' 6 (1.676 m)   Wt 151 lb (68.5 kg)   BMI 24.37 kg/m     Skin warm and dry.Pelvic: external genitalia is normal in appearance no lesions, vagina: pale, milex ring #4 with support removed, creamy discharge on pessary, no odor, +prolapse, no odor, pessary washed with soap and water and reinserted. Examination chaperoned by Clarita Salt LPN Fall risk is low  [facility] - 02/24/24 1055       Pregnancy Intention Screening   Does the patient want to become pregnant in the next year? N/A    Does the patient's partner want to become pregnant in the next year? N/A    Would the patient like to discuss contraceptive options today? N/A      Contraception Wrap Up   Current Method Abstinence;Female Sterilization;Post-Menopause    End Method Abstinence;Female Sterilization;Post-Menopause    Contraception Counseling Provided No          Assessment:     1. Pessary maintenance, Milex ring with support #4, place 12/2017 (Primary) Cleaned and reinserted  2. Cystocele with incomplete uterovaginal prolapse     Plan:     Follow up in 4 months for pessary maintenance or sooner if needed

## 2024-02-25 ENCOUNTER — Ambulatory Visit (HOSPITAL_COMMUNITY)
Admission: RE | Admit: 2024-02-25 | Discharge: 2024-02-25 | Disposition: A | Discharge status: Home / Self Care | Source: Ambulatory Visit | Attending: Internal Medicine | Admitting: Internal Medicine

## 2024-02-25 DIAGNOSIS — Z1231 Encounter for screening mammogram for malignant neoplasm of breast: Secondary | ICD-10-CM | POA: Diagnosis not present

## 2024-06-26 ENCOUNTER — Ambulatory Visit: Admitting: Adult Health
